# Patient Record
Sex: Female | Born: 1995
Health system: Southern US, Community
[De-identification: ages and names within clinical notes are randomized; demographics above are authoritative.]

## PROBLEM LIST (undated history)

## (undated) DIAGNOSIS — J45909 Unspecified asthma, uncomplicated: Secondary | ICD-10-CM

## (undated) DIAGNOSIS — O021 Missed abortion: Secondary | ICD-10-CM

## (undated) HISTORY — DX: Missed abortion: O02.1

---
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
## 2015-05-09 ENCOUNTER — Emergency Department (HOSPITAL_COMMUNITY)
Admission: EM | Admit: 2015-05-09 | Discharge: 2015-05-09 | Disposition: A | Discharge status: Home / Self Care | Payer: Medicaid Other | Attending: Emergency Medicine | Admitting: Emergency Medicine

## 2015-05-09 ENCOUNTER — Encounter (HOSPITAL_COMMUNITY): Payer: Self-pay | Admitting: Emergency Medicine

## 2015-05-09 DIAGNOSIS — Z3202 Encounter for pregnancy test, result negative: Secondary | ICD-10-CM | POA: Insufficient documentation

## 2015-05-09 DIAGNOSIS — Z79899 Other long term (current) drug therapy: Secondary | ICD-10-CM | POA: Diagnosis not present

## 2015-05-09 DIAGNOSIS — R51 Headache: Secondary | ICD-10-CM | POA: Insufficient documentation

## 2015-05-09 DIAGNOSIS — R55 Syncope and collapse: Secondary | ICD-10-CM | POA: Diagnosis present

## 2015-05-09 DIAGNOSIS — J45909 Unspecified asthma, uncomplicated: Secondary | ICD-10-CM | POA: Insufficient documentation

## 2015-05-09 DIAGNOSIS — Z91040 Latex allergy status: Secondary | ICD-10-CM | POA: Diagnosis not present

## 2015-05-09 DIAGNOSIS — D509 Iron deficiency anemia, unspecified: Secondary | ICD-10-CM

## 2015-05-09 LAB — POC URINE PREG, ED: Preg Test, Ur: NEGATIVE

## 2015-05-09 LAB — URINALYSIS, ROUTINE W REFLEX MICROSCOPIC
Bilirubin Urine: NEGATIVE
Glucose, UA: NEGATIVE mg/dL
Hgb urine dipstick: NEGATIVE
Ketones, ur: 15 mg/dL — AB
NITRITE: NEGATIVE
PH: 6.5 (ref 5.0–8.0)
Protein, ur: NEGATIVE mg/dL
SPECIFIC GRAVITY, URINE: 1.021 (ref 1.005–1.030)
UROBILINOGEN UA: 0.2 mg/dL (ref 0.0–1.0)

## 2015-05-09 LAB — CBC
HEMATOCRIT: 34.8 % — AB (ref 36.0–46.0)
HEMOGLOBIN: 10.8 g/dL — AB (ref 12.0–15.0)
MCH: 23.6 pg — ABNORMAL LOW (ref 26.0–34.0)
MCHC: 31 g/dL (ref 30.0–36.0)
MCV: 76 fL — ABNORMAL LOW (ref 78.0–100.0)
Platelets: 254 10*3/uL (ref 150–400)
RBC: 4.58 MIL/uL (ref 3.87–5.11)
RDW: 18.4 % — AB (ref 11.5–15.5)
WBC: 7 10*3/uL (ref 4.0–10.5)

## 2015-05-09 LAB — BASIC METABOLIC PANEL
ANION GAP: 7 (ref 5–15)
BUN: 11 mg/dL (ref 6–20)
CALCIUM: 8.9 mg/dL (ref 8.9–10.3)
CO2: 22 mmol/L (ref 22–32)
Chloride: 108 mmol/L (ref 101–111)
Creatinine, Ser: 0.8 mg/dL (ref 0.44–1.00)
Glucose, Bld: 96 mg/dL (ref 65–99)
POTASSIUM: 4.4 mmol/L (ref 3.5–5.1)
SODIUM: 137 mmol/L (ref 135–145)

## 2015-05-09 LAB — URINE MICROSCOPIC-ADD ON

## 2015-05-09 MED ORDER — DOCUSATE SODIUM 100 MG PO CAPS: 100.0000 mg | ORAL_CAPSULE | Freq: Two times a day (BID) | ORAL | Status: DC

## 2015-05-09 MED ORDER — FERROUS SULFATE 325 (65 FE) MG PO TABS: 325.0000 mg | ORAL_TABLET | Freq: Every day | ORAL | Status: DC

## 2015-05-09 MED ORDER — SODIUM CHLORIDE 0.9 % IV BOLUS (SEPSIS)
1000.0000 mL | Freq: Once | INTRAVENOUS | Status: AC
  Administered 2015-05-09: 1000 mL via INTRAVENOUS

## 2015-05-09 NOTE — ED Notes (Signed)
Per EMS: pt had near syncopal episode while [place] around [place]. Pt felt hot, sat down on floor and then laid down never lost consciousness. Has hx of near syncope.

## 2015-05-09 NOTE — ED Notes (Signed)
PA-C at bedside 

## 2015-05-09 NOTE — ED Notes (Signed)
Patient aware that a urine sample is needed, patient will notify staff when able.

## 2015-05-09 NOTE — ED Provider Notes (Signed)
CSN: [id number]     Arrival date & time 05/09/15  1632 History   First MD Initiated Contact with Patient 05/09/15 1701     Chief Complaint  Patient presents with  . Near Syncope   Allison Simmons is a 19 y.o. female with a history of anemia who presents to the ED with her mother after a syncopal episode while [place] [employer]. The patient reports she was shopping this afternoon and while [place] in [place] [employer] she felt lightheaded and like she might pass out. She reports sitting on the ground where her family reports she passed out for a split second. They deny any seizure-like activity. The patient denies any prodromal symptoms of chest pain, shortness of breath or palpitations. Patient complains of some positional lightheadedness. She also complains of a 3 out of 10 headache currently. Family denies any head injury or falls. Patient reports history of anemia. Patient does not take any iron supplementation. Her last cycle was 04/22/2015. Her primary care doctor is Dr. Sheliah Hatch. The patient denies fevers, chills, recent illness, chest pain, shortness of breath, palpitations, leg pain, leg swelling, abdominal pain, nausea, vomiting, urinary symptoms, numbness, tingling, weakness, double vision, cough or rashes.   (Consider location/radiation/quality/duration/timing/severity/associated sxs/prior Treatment) HPI  Past Medical History  Diagnosis Date  . Asthma    History reviewed. No pertinent past surgical history. History reviewed. No pertinent family history. History  Substance Use Topics  . Smoking status: Never Smoker   . Smokeless tobacco: Not on file  . Alcohol Use: Not on file   OB History    No data available     Review of Systems  Constitutional: Negative for fever and chills.  HENT: Negative for congestion and sore throat.   Eyes: Negative for pain and visual disturbance.  Respiratory: Negative for cough, shortness of breath and wheezing.   Cardiovascular: Negative for  chest pain, palpitations and leg swelling.  Gastrointestinal: Negative for nausea, vomiting, abdominal pain and diarrhea.  Genitourinary: Negative for dysuria, urgency, frequency, hematuria, difficulty urinating and menstrual problem.  Musculoskeletal: Negative for back pain and neck pain.  Skin: Negative for rash.  Neurological: Positive for syncope, light-headedness and headaches. Negative for dizziness, weakness and numbness.      Allergies  Latex  Home Medications   Prior to Admission medications   Medication Sig Start Date End Date Taking? Authorizing Provider  acetaminophen (TYLENOL) 500 MG tablet Take 500 mg by mouth once.   Yes Historical Provider, MD  levonorgestrel-ethinyl estradiol (AVIANE,ALESSE,LESSINA) 0.1-20 MG-MCG tablet Take 1 tablet by mouth daily.   Yes Historical Provider, MD  naproxen (NAPROSYN) 500 MG tablet Take 1 tablet (500 mg total) by mouth 2 (two) times daily. Patient taking differently: Take 500 mg by mouth 2 (two) times daily as needed for mild pain or moderate pain.  02/11/14  Yes Tatyana Kirichenko, PA-C  ranitidine (ZANTAC) 150 MG tablet Take 150 mg by mouth 2 (two) times daily as needed for heartburn.   Yes Historical Provider, MD  docusate sodium (COLACE) 100 MG capsule Take 1 capsule (100 mg total) by mouth every 12 (twelve) hours. 05/09/15   Everlene Farrier, PA-C  famotidine (PEPCID) 20 MG tablet Take 1 tablet (20 mg total) by mouth 2 (two) times daily. Patient not taking: Reported on 05/09/2015 02/11/14   Jaynie Crumble, PA-C  ferrous sulfate 325 (65 FE) MG tablet Take 1 tablet (325 mg total) by mouth daily. 05/09/15   Everlene Farrier, PA-C   BP 111/68 mmHg  Pulse 93  Temp(Src) 97.7 F (36.5 C) (Oral)  Resp 18  SpO2 100%  LMP 04/13/2015 (Approximate) Physical Exam  Constitutional: She is oriented to person, place, and time. She appears well-developed and well-nourished. No distress.  Nontoxic appearing.  HENT:  Head: Normocephalic and atraumatic.   Right Ear: External ear normal.  Left Ear: External ear normal.  Mouth/Throat: Oropharynx is clear and moist. No oropharyngeal exudate.  Bilateral tympanic membranes are pearly-gray without erythema or loss of landmarks. Head is atraumatic.   Eyes: Conjunctivae and EOM are normal. Pupils are equal, round, and reactive to light. Right eye exhibits no discharge. Left eye exhibits no discharge.  Neck: Normal range of motion. Neck supple. No JVD present. No tracheal deviation present.  Cardiovascular: Normal rate, regular rhythm, normal heart sounds and intact distal pulses.  Exam reveals no gallop and no friction rub.   No murmur heard. Bilateral radial and posterior tibialis pulses are intact.  Pulmonary/Chest: Effort normal and breath sounds normal. No respiratory distress. She has no wheezes. She has no rales. She exhibits no tenderness.  Lungs are CTA bilaterally. No chest wall tenderness.   Abdominal: Soft. Bowel sounds are normal. She exhibits no distension. There is no tenderness. There is no guarding.  Abdomen is soft and nontender to palpation.  Musculoskeletal: Normal range of motion. She exhibits no edema or tenderness.  No lower extremity edema or tenderness. Patient has 5 out of 5 strength in her bilateral upper and lower extremities. Patient is able to ambulate without difficulty or assistance and with a normal gait.  Lymphadenopathy:    She has no cervical adenopathy.  Neurological: She is alert and oriented to person, place, and time. No cranial nerve deficit. Coordination normal.  The patient is alert and oriented 3. Cranial nerves are intact. Finger to nose intact bilaterally. Sensation intact to bilateral upper extremities. Vision is grossly intact. EOMs intact bilaterally. Normal gait.  Skin: Skin is warm and dry. No rash noted. She is not diaphoretic. No erythema. No pallor.  Psychiatric: She has a normal mood and affect. Her behavior is normal.  Nursing note and vitals  reviewed.   ED Course  Procedures (including critical care time) Labs Review Labs Reviewed  CBC - Abnormal; Notable for the following:    Hemoglobin 10.8 (*)    HCT 34.8 (*)    MCV 76.0 (*)    MCH 23.6 (*)    RDW 18.4 (*)    All other components within normal limits  URINALYSIS, ROUTINE W REFLEX MICROSCOPIC (NOT AT [facility]) - Abnormal; Notable for the following:    APPearance CLOUDY (*)    Ketones, ur 15 (*)    Leukocytes, UA MODERATE (*)    All other components within normal limits  URINE MICROSCOPIC-ADD ON - Abnormal; Notable for the following:    Squamous Epithelial / LPF FEW (*)    Bacteria, UA FEW (*)    All other components within normal limits  URINE CULTURE  BASIC METABOLIC PANEL  POC URINE PREG, ED    Imaging Review No results found.   EKG Interpretation   Date/Time:  Saturday May 09 2015 16:44:37 EDT Ventricular Rate:  76 PR Interval:  128 QRS Duration: 71 QT Interval:  401 QTC Calculation: 451 R Axis:   50 Text Interpretation:  Sinus rhythm Benign early repolarization Confirmed  by LITTLE MD, RACHEL ([id number]) on 05/09/2015 8:01:02 PM      Filed Vitals:   05/09/15 1639 05/09/15 1847 05/09/15 2001  BP:  103/60 126/85 111/68  Pulse: 77 79 93  Temp: 97.7 F (36.5 C) 97.7 F (36.5 C)   TempSrc: Oral Oral   Resp: SpO2: 100% 100% 100%     MDM   Meds given in ED:  Medications  sodium chloride 0.9 % bolus 1,000 mL (0 mLs Intravenous Stopped 05/09/15 2002)    Discharge Medication List as of 05/09/2015  7:58 PM    START taking these medications   Details  docusate sodium (COLACE) 100 MG capsule Take 1 capsule (100 mg total) by mouth every 12 (twelve) hours., Starting 05/09/2015, Until Discontinued, Print    ferrous sulfate 325 (65 FE) MG tablet Take 1 tablet (325 mg total) by mouth daily., Starting 05/09/2015, Until Discontinued, Print        Final diagnoses:  Syncope and collapse  IDA (iron deficiency anemia)   This  is a 19 y.o. female  with a history of anemia who presents to the ED with her mother after a syncopal episode while [place] [employer]. The patient reports she was shopping this afternoon and while [place] [employer] she felt lightheaded and like she might pass out. She reports sitting on the ground where her family reports she passed out for a split second. The deny any seizure-like activity or falls. The patient denies any prodromal symptoms of chest pain, shortness of breath or palpitations. Patient complains of some positional lightheadedness.  On exam patient is afebrile nontoxic appearing. The patient has no focal neurological deficits. Her abdomen is soft and nontender to palpation. EKG is unremarkable. She is a negative urine pregnancy test. Urinalysis shows moderate leukocytes with few bacteria and is nitrite negative. Patient denies any urinary symptoms. Will send urine for culture. BMP is within normal limits. CBC indicates a hemoglobin of 10.8. CBC consistent with iron deficiency anemia. The patient has a history of IDA. On reevaluation patient reports she is feeling much better. She denies feeling lightheaded. She is able to ambulate with a normal gait. She denies feeling lightheaded or dizzy with ambulation. She reports feeling ready for discharge. We'll discharge with prescriptions for iron and Colace. I encouraged her to follow-up with her primary care provider. Strict return precautions provided. I advised the patient to follow-up with their primary care provider this week. I advised the patient to return to the emergency department with new or worsening symptoms or new concerns. The patient verbalized understanding and agreement with plan.      Everlene Farrier, PA-C 05/09/15 2015  Laurence Spates, MD 05/09/15 2229

## 2015-05-09 NOTE — Discharge Instructions (Signed)
Syncope °Syncope is a medical term for fainting or passing out. This means you lose consciousness and drop to the ground. People are generally unconscious for less than 5 minutes. You may have some muscle twitches for up to 15 seconds before waking up and returning to normal. Syncope occurs more often in older adults, but it can happen to anyone. While most causes of syncope are not dangerous, syncope can be a sign of a serious medical problem. It is important to seek medical care.  °CAUSES  °Syncope is caused by a sudden drop in blood flow to the brain. The specific cause is often not determined. Factors that can bring on syncope include: °· Taking medicines that lower blood pressure. °· Sudden changes in posture, such as standing up quickly. °· Taking more medicine than prescribed. °· Standing in one place for too long. °· Seizure disorders. °· Dehydration and excessive exposure to heat. °· Low blood sugar (hypoglycemia). °· Straining to have a bowel movement. °· Heart disease, irregular heartbeat, or other circulatory problems. °· Fear, emotional distress, seeing blood, or severe pain. °SYMPTOMS  °Right before fainting, you may: °· Feel dizzy or light-headed. °· Feel nauseous. °· See all white or all black in your field of vision. °· Have cold, clammy skin. °DIAGNOSIS  °Your health care provider will ask about your symptoms, perform a physical exam, and perform an electrocardiogram (ECG) to record the electrical activity of your heart. Your health care provider may also perform other heart or blood tests to determine the cause of your syncope which may include: °· Transthoracic echocardiogram (TTE). During echocardiography, sound waves are used to evaluate how blood flows through your heart. °· Transesophageal echocardiogram (TEE). °· Cardiac monitoring. This allows your health care provider to monitor your heart rate and rhythm in real time. °· Holter monitor. This is a portable device that records your  heartbeat and can help diagnose heart arrhythmias. It allows your health care provider to track your heart activity for several days, if needed. °· Stress tests by exercise or by giving medicine that makes the heart beat faster. °TREATMENT  °In most cases, no treatment is needed. Depending on the cause of your syncope, your health care provider may recommend changing or stopping some of your medicines. °HOME CARE INSTRUCTIONS °· Have someone stay with you until you feel stable. °· Do not drive, use machinery, or play sports until your health care provider says it is okay. °· Keep all follow-up appointments as directed by your health care provider. °· Lie down right away if you start feeling like you might faint. Breathe deeply and steadily. Wait until all the symptoms have passed. °· Drink enough fluids to keep your urine clear or pale yellow. °· If you are taking blood pressure or heart medicine, get up slowly and take several minutes to sit and then stand. This can reduce dizziness. °SEEK IMMEDIATE MEDICAL CARE IF:  °· You have a severe headache. °· You have unusual pain in the chest, abdomen, or back. °· You are bleeding from your mouth or rectum, or you have black or tarry stool. °· You have an irregular or very fast heartbeat. °· You have pain with breathing. °· You have repeated fainting or seizure-like jerking during an episode. °· You faint when sitting or lying down. °· You have confusion. °· You have trouble walking. °· You have severe weakness. °· You have vision problems. °If you fainted, call your local emergency services (911 in [place] Do not drive   yourself to the hospital.  MAKE SURE YOU:  Understand these instructions.  Will watch your condition.  Will get help right away if you are not doing well or get worse. Document Released: 09/26/2005 Document Revised: 10/01/2013 Document Reviewed: 11/25/2011 [facility] Patient Information 2015 [place], [place]. This information is not intended to replace  advice given to you by your health care provider. Make sure you discuss any questions you have with your health care provider. Iron Deficiency Anemia Anemia is a condition in which there are less red blood cells or hemoglobin in the blood than normal. Hemoglobin is the part of red blood cells that carries oxygen. Iron deficiency anemia is anemia caused by too little iron. It is the most common type of anemia. It may leave you tired and short of breath. CAUSES   Lack of iron in the diet.  Poor absorption of iron, as seen with intestinal disorders.  Intestinal bleeding.  Heavy periods. SIGNS AND SYMPTOMS  Mild anemia may not be noticeable. Symptoms may include:  Fatigue.  Headache.  Pale skin.  Weakness.  Tiredness.  Shortness of breath.  Dizziness.  Cold hands and feet.  Fast or irregular heartbeat. DIAGNOSIS  Diagnosis requires a thorough evaluation and physical exam by your health care provider. Blood tests are generally used to confirm iron deficiency anemia. Additional tests may be done to find the underlying cause of your anemia. These may include:  Testing for blood in the stool (fecal occult blood test).  A procedure to see inside the colon and rectum (colonoscopy).  A procedure to see inside the esophagus and stomach (endoscopy). TREATMENT  Iron deficiency anemia is treated by correcting the cause of the deficiency. Treatment may involve:  Adding iron-rich foods to your diet.  Taking iron supplements. Pregnant or breastfeeding women need to take extra iron because their normal diet usually does not provide the required amount.  Taking vitamins. Vitamin C improves the absorption of iron. Your health care provider may recommend that you take your iron tablets with a glass of orange juice or vitamin C supplement.  Medicines to make heavy menstrual flow lighter.  Surgery. HOME CARE INSTRUCTIONS   Take iron as directed by your health care provider.  If you  cannot tolerate taking iron supplements by mouth, talk to your health care provider about taking them through a vein (intravenously) or an injection into a muscle.  For the best iron absorption, iron supplements should be taken on an empty stomach. If you cannot tolerate them on an empty stomach, you may need to take them with food.  Do not drink milk or take antacids at the same time as your iron supplements. Milk and antacids may interfere with the absorption of iron.  Iron supplements can cause constipation. Make sure to include fiber in your diet to prevent constipation. A stool softener may also be recommended.  Take vitamins as directed by your health care provider.  Eat a diet rich in iron. Foods high in iron include liver, lean beef, whole-grain bread, eggs, dried fruit, and dark green leafy vegetables. SEEK IMMEDIATE MEDICAL CARE IF:   You faint. If this happens, do not drive. Call your local emergency services (911 in [place] if no other help is available.  You have chest pain.  You feel nauseous or vomit.  You have severe or increased shortness of breath with activity.  You feel weak.  You have a rapid heartbeat.  You have unexplained sweating.  You become light-headed when getting up  from a chair or bed. MAKE SURE YOU:   Understand these instructions.  Will watch your condition.  Will get help right away if you are not doing well or get worse. Document Released: 09/23/2000 Document Revised: 10/01/2013 Document Reviewed: 06/03/2013 [facility] Patient Information 2015 [place], [place]. This information is not intended to replace advice given to you by your health care provider. Make sure you discuss any questions you have with your health care provider.

## 2015-05-09 NOTE — ED Notes (Signed)
Patient is unable to use restroom at this time. Will let us know when she is able to go.

## 2015-05-09 NOTE — ED Notes (Signed)
AVS explained in detail. Knows to take antacids separate from iron supplements. Encouraged to eat a diet high in fiber and to follow up with pediatrics. Knows stool may change colors while taking iron supplements. No other c/c. Ambulatory with steady gait.

## 2015-05-12 LAB — URINE CULTURE

## 2015-12-17 ENCOUNTER — Ambulatory Visit: Payer: Self-pay | Admitting: Obstetrics

## 2016-04-14 ENCOUNTER — Inpatient Hospital Stay (HOSPITAL_COMMUNITY)
Admission: AD | Admit: 2016-04-14 | Discharge: 2016-04-14 | Disposition: A | Discharge status: Home / Self Care | Payer: 59 | Source: Ambulatory Visit | Attending: Obstetrics and Gynecology | Admitting: Obstetrics and Gynecology

## 2016-04-14 ENCOUNTER — Encounter (HOSPITAL_COMMUNITY): Payer: Self-pay | Admitting: *Deleted

## 2016-04-14 ENCOUNTER — Inpatient Hospital Stay (HOSPITAL_COMMUNITY): Payer: 59

## 2016-04-14 DIAGNOSIS — O3680X0 Pregnancy with inconclusive fetal viability, not applicable or unspecified: Secondary | ICD-10-CM

## 2016-04-14 DIAGNOSIS — IMO0002 Reserved for concepts with insufficient information to code with codable children: Secondary | ICD-10-CM

## 2016-04-14 DIAGNOSIS — Z91040 Latex allergy status: Secondary | ICD-10-CM | POA: Insufficient documentation

## 2016-04-14 DIAGNOSIS — O0289 Other abnormal products of conception: Secondary | ICD-10-CM | POA: Diagnosis not present

## 2016-04-14 DIAGNOSIS — O021 Missed abortion: Secondary | ICD-10-CM | POA: Insufficient documentation

## 2016-04-14 LAB — POCT PREGNANCY, URINE: PREG TEST UR: POSITIVE — AB

## 2016-04-14 NOTE — Discharge Instructions (Signed)
Incomplete Miscarriage A miscarriage is the sudden loss of an unborn baby (fetus) before the 20th week of pregnancy. In an incomplete miscarriage, parts of the fetus or placenta (afterbirth) remain in the body.  Having a miscarriage can be an emotional experience. Talk with your health care provider about any questions you may have about miscarrying, the grieving process, and your future pregnancy plans. CAUSES   Problems with the fetal chromosomes that make it impossible for the baby to develop normally. Problems with the baby's genes or chromosomes are most often the result of errors that occur by chance as the embryo divides and grows. The problems are not inherited from the parents.  Infection of the cervix or uterus.  Hormone problems.  Problems with the cervix, such as having an incompetent cervix. This is when the tissue in the cervix is not strong enough to hold the pregnancy.  Problems with the uterus, such as an abnormally shaped uterus, uterine fibroids, or congenital abnormalities.  Certain medical conditions.  Smoking, drinking alcohol, or taking illegal drugs.  Trauma. SYMPTOMS   Vaginal bleeding or spotting, with or without cramps or pain.  Pain or cramping in the abdomen or lower back.  Passing fluid, tissue, or blood clots from the vagina. DIAGNOSIS  Your health care provider will perform a physical exam. You may also have an ultrasound to confirm the miscarriage. Blood or urine tests may also be ordered. TREATMENT   Usually, a dilation and curettage (D&C) procedure is performed. During a D&C procedure, the cervix is widened (dilated) and any remaining fetal or placental tissue is gently removed from the uterus.  Antibiotic medicines are prescribed if there is an infection. Other medicines may be given to reduce the size of the uterus (contract) if there is a lot of bleeding.  If you have Rh negative blood and your baby was Rh positive, you will need a Rho (D)  immune globulin shot. This shot will protect any future baby from having Rh blood problems in future pregnancies.  You may be confined to bed rest. This means you should stay in bed and only get up to use the bathroom. HOME CARE INSTRUCTIONS   Rest as directed by your health care provider.  Restrict activity as directed by your health care provider. You may be allowed to continue light activity if curettage was not done but you require further treatment.  Keep track of the number of pads you use each day. Keep track of how soaked (saturated) they are. Record this information.  Do not  use tampons.  Do not douche or have sexual intercourse until approved by your health care provider.  Keep all follow-up appointments for reevaluation and continuing management.  Only take over-the-counter or prescription medicines for pain, fever, or discomfort as directed by your health care provider.  Take antibiotic medicine as directed by your health care provider. Make sure you finish it even if you start to feel better. SEEK IMMEDIATE MEDICAL CARE IF:   You experience severe cramps in your stomach, back, or abdomen.  You have an unexplained temperature (make sure to record these temperatures).  You pass large clots or tissue (save these for your health care provider to inspect).  Your bleeding increases.  You become light-headed, weak, or have fainting episodes. MAKE SURE YOU:   Understand these instructions.  Will watch your condition.  Will get help right away if you are not doing well or get worse.   This information is not intended to   replace advice given to you by your health care provider. Make sure you discuss any questions you have with your health care provider.   Document Released: 09/26/2005 Document Revised: 10/17/2014 Document Reviewed: 04/25/2013 Elsevier Interactive Patient Education 2016 Elsevier Inc.  

## 2016-04-14 NOTE — MAU Note (Signed)
Urine in lab 

## 2016-04-14 NOTE — MAU Note (Signed)
Pt just left the pregnancy care center and u/s there did not detect a heartbeat and measured her at 8 weeks when she should be 10 weeks.

## 2016-04-14 NOTE — MAU Provider Note (Signed)
History     CSN: [id number]  Arrival date and time: 04/14/16 1842   None     No chief complaint on file.  HPI Comments: G1 @[redacted]w[redacted]d  by sure LMP here for confirmation of pregnancy viability. Pt seen at the pregnancy care center earlier today and was told her pregnancy measured 8 wks with absent cardiac activity. She denies VB, cramping, or pain. She reports some nausea but tolerating po w/o problem. [place]    [place]    Gravida Para Term Preterm AB TAB SAB Ectopic Multiple Living   1               Past Medical History  Diagnosis Date  . Asthma     No past surgical history on file.  No family history on file.  Social History  Substance Use Topics  . Smoking status: Never Smoker   . Smokeless tobacco: Not on file  . Alcohol Use: Not on file    Allergies:  Allergies  Allergen Reactions  . Latex Rash    Prescriptions prior to admission  Medication Sig Dispense Refill Last Dose  . acetaminophen (TYLENOL) 500 MG tablet Take 500 mg by mouth once.   05/04/2015  . docusate sodium (COLACE) 100 MG capsule Take 1 capsule (100 mg total) by mouth every 12 (twelve) hours. 60 capsule 0   . famotidine (PEPCID) 20 MG tablet Take 1 tablet (20 mg total) by mouth 2 (two) times daily. (Patient not taking: Reported on 05/09/2015) 30 tablet 0 Not Taking at Unknown time  . ferrous sulfate 325 (65 FE) MG tablet Take 1 tablet (325 mg total) by mouth daily. 30 tablet 0   . levonorgestrel-ethinyl estradiol (AVIANE,ALESSE,LESSINA) 0.1-20 MG-MCG tablet Take 1 tablet by mouth daily.   05/09/2015 at Unknown time  . naproxen (NAPROSYN) 500 MG tablet Take 1 tablet (500 mg total) by mouth 2 (two) times daily. (Patient taking differently: Take 500 mg by mouth 2 (two) times daily as needed for mild pain or moderate pain. ) 30 tablet 0 Past Week at Unknown time  . ranitidine (ZANTAC) 150 MG tablet Take 150 mg by mouth 2 (two) times daily as needed  for heartburn.   2 weeks    Review of Systems  Constitutional: Negative.   HENT: Negative.   Eyes: Negative.   Respiratory: Negative.   Cardiovascular: Negative.   Gastrointestinal: Positive for nausea. Negative for vomiting.  Genitourinary:       +polyuria  Musculoskeletal: Negative.   Skin: Negative.   Neurological: Negative.   Endo/Heme/Allergies: Negative.   Psychiatric/Behavioral: Negative.    Physical Exam   Blood pressure 111/66, pulse 71, temperature 98.2 F (36.8 C), temperature source Oral, resp. rate 17, height 5' (1.524 m), weight 104 lb (47.174 kg), last menstrual period 02/02/2016, SpO2 100 %.  Physical Exam  Constitutional: She is oriented to person, place, and time. She appears well-developed and well-nourished.  HENT:  Head: Normocephalic and atraumatic.  Neck: Normal range of motion.  Cardiovascular: Normal rate.   Respiratory: Effort normal.  GI:  deferred  Genitourinary:  deferred  Musculoskeletal: Normal range of motion.  Neurological: She is alert and oriented to person, place, and time.  Psychiatric: She has a normal mood and affect.    MAU Course  Procedures Results for orders placed or performed during the hospital encounter of 04/14/16 (from the past 24 hour(s))  Pregnancy, urine POC  Status: Abnormal   Collection Time: 04/14/16  6:58 PM  Result Value Ref Range   Preg Test, Ur POSITIVE (A) NEGATIVE   [place] Ob Comp Less 14 Wks  04/14/2016  CLINICAL DATA:  Early pregnancy.  No fetal heart tones demonstrated. EXAM: OBSTETRIC <14 WK US AND TRANSVAGINAL OB US TECHNIQUE: Both transabdominal and transvaginal ultrasound examinations were performed for complete evaluation of the gestation as well as the maternal uterus, adnexal regions, and pelvic cul-de-sac. Transvaginal technique was performed to assess early pregnancy. COMPARISON:  None. FINDINGS: Intrauterine gestational sac: Single, grossly normal. Yolk sac:  Present Embryo:  Present Cardiac  Activity: Absent CRL:  25  mm   9 w   1 d Subchorionic hemorrhage:  None visualized. Maternal uterus/adnexae: Normal IMPRESSION: Fetal demise. No cardiac activity, with gestational age of [redacted] weeks 1 day by crown-rump length Electronically Signed   By: Paulina FusiMark  Shogry M.D.   On: 04/14/2016 21:07   [place] Ob Transvaginal  04/14/2016  CLINICAL DATA:  Early pregnancy.  No fetal heart tones demonstrated. EXAM: OBSTETRIC <14 WK US AND TRANSVAGINAL OB US TECHNIQUE: Both transabdominal and transvaginal ultrasound examinations were performed for complete evaluation of the gestation as well as the maternal uterus, adnexal regions, and pelvic cul-de-sac. Transvaginal technique was performed to assess early pregnancy. COMPARISON:  None. FINDINGS: Intrauterine gestational sac: Single, grossly normal. Yolk sac:  Present Embryo:  Present Cardiac Activity: Absent CRL:  25  mm   9 w   1 d Subchorionic hemorrhage:  None visualized. Maternal uterus/adnexae: Normal IMPRESSION: Fetal demise. No cardiac activity, with gestational age of [redacted] weeks 1 day by crown-rump length Electronically Signed   By: Paulina FusiMark  Shogry M.D.   On: 04/14/2016 21:07     MDM Labs and sono ordered and reviewed. 443w1d IUP w/o cardiac activity. Discussed options: Cytotec vs. expectant mngt, pt prefers expectant mngt for now. SAB precautions discussed. Stable for discharge home.  Assessment and Plan   1. Non-viable pregnancy   2. Pregnancy with inconclusive fetal viability   3. Fetal demise, less than 22 weeks     Discharge home Follow-up in [facility] in 1 wk or sooner if desires medical mngt-message routed to clinic SAB precautions   Donette LarryMelanie Raiyan D[place]andro, CNM 04/14/2016, 7:31 PM

## 2016-04-29 ENCOUNTER — Ambulatory Visit (INDEPENDENT_AMBULATORY_CARE_PROVIDER_SITE_OTHER): Payer: 59 | Admitting: Advanced Practice Midwife

## 2016-04-29 ENCOUNTER — Encounter: Payer: Self-pay | Admitting: Advanced Practice Midwife

## 2016-04-29 VITALS — BP 113/74 | HR 90 | Wt 108.3 lb

## 2016-04-29 DIAGNOSIS — O3680X0 Pregnancy with inconclusive fetal viability, not applicable or unspecified: Secondary | ICD-10-CM | POA: Diagnosis not present

## 2016-04-29 DIAGNOSIS — O034 Incomplete spontaneous abortion without complication: Secondary | ICD-10-CM

## 2016-04-29 DIAGNOSIS — O021 Missed abortion: Secondary | ICD-10-CM

## 2016-04-29 DIAGNOSIS — Z3490 Encounter for supervision of normal pregnancy, unspecified, unspecified trimester: Secondary | ICD-10-CM

## 2016-04-29 DIAGNOSIS — O3680X1 Pregnancy with inconclusive fetal viability, fetus 1: Secondary | ICD-10-CM

## 2016-04-29 DIAGNOSIS — Z36 Encounter for antenatal screening of mother: Secondary | ICD-10-CM | POA: Diagnosis not present

## 2016-04-29 HISTORY — DX: Missed abortion: O02.1

## 2016-04-29 LAB — CBC
HCT: 37.4 % (ref 35.0–45.0)
Hemoglobin: 12.2 g/dL (ref 11.7–15.5)
MCH: 26.8 pg — AB (ref 27.0–33.0)
MCHC: 32.6 g/dL (ref 32.0–36.0)
MCV: 82.2 fL (ref 80.0–100.0)
MPV: 10.4 fL (ref 7.5–12.5)
Platelets: 280 10*3/uL (ref 140–400)
RBC: 4.55 MIL/uL (ref 3.80–5.10)
RDW: 14.4 % (ref 11.0–15.0)
WBC: 5.7 10*3/uL (ref 3.8–10.8)

## 2016-04-29 MED ORDER — PROMETHAZINE HCL 25 MG PO TABS: 25.0000 mg | ORAL_TABLET | Freq: Four times a day (QID) | ORAL | Status: DC | PRN

## 2016-04-29 MED ORDER — IBUPROFEN 600 MG PO TABS: 600.0000 mg | ORAL_TABLET | Freq: Four times a day (QID) | ORAL | Status: DC | PRN

## 2016-04-29 MED ORDER — MISOPROSTOL 200 MCG PO TABS
ORAL_TABLET | ORAL | Status: DC
Start: 2016-04-29 — End: 2018-12-12

## 2016-04-29 NOTE — Patient Instructions (Addendum)
Incomplete Miscarriage A miscarriage is the sudden loss of an unborn baby (fetus) before the 20th week of pregnancy. In an incomplete miscarriage, parts of the fetus or placenta (afterbirth) remain in the body.  Having a miscarriage can be an emotional experience. Talk with your health care provider about any questions you may have about miscarrying, the grieving process, and your future pregnancy plans. CAUSES   Problems with the fetal chromosomes that make it impossible for the baby to develop normally. Problems with the baby's genes or chromosomes are most often the result of errors that occur by chance as the embryo divides and grows. The problems are not inherited from the parents.  Infection of the cervix or uterus.  Hormone problems.  Problems with the cervix, such as having an incompetent cervix. This is when the tissue in the cervix is not strong enough to hold the pregnancy.  Problems with the uterus, such as an abnormally shaped uterus, uterine fibroids, or congenital abnormalities.  Certain medical conditions.  Smoking, drinking alcohol, or taking illegal drugs.  Trauma. SYMPTOMS   Vaginal bleeding or spotting, with or without cramps or pain.  Pain or cramping in the abdomen or lower back.  Passing fluid, tissue, or blood clots from the vagina. DIAGNOSIS  Your health care provider will perform a physical exam. You may also have an ultrasound to confirm the miscarriage. Blood or urine tests may also be ordered. TREATMENT   You may be given medicine to make the uterus cramp and pass any remaining pregnancy tissue.   A dilation and curettage (D&C) procedure is performed. During a D&C procedure, the cervix is widened (dilated) and any remaining fetal or placental tissue is gently removed from the uterus.  Antibiotic medicines are prescribed if there is an infection. Other medicines may be given to reduce the size of the uterus (contract) if there is a lot of bleeding.  If  you have Rh negative blood and your baby was Rh positive, you will need a Rho (D) immune globulin shot. This shot will protect any future baby from having Rh blood problems in future pregnancies.  You may be confined to bed rest. This means you should stay in bed and only get up to use the bathroom. HOME CARE INSTRUCTIONS   Rest as directed by your health care provider.  Restrict activity as directed by your health care provider. You may be allowed to continue light activity if curettage was not done but you require further treatment.  Keep track of the number of pads you use each day. Keep track of how soaked (saturated) they are. Record this information.  Do not  use tampons.  Do not douche or have sexual intercourse until approved by your health care provider.  Keep all follow-up appointments for reevaluation and continuing management.  Only take over-the-counter or prescription medicines for pain, fever, or discomfort as directed by your health care provider.  Take antibiotic medicine as directed by your health care provider. Make sure you finish it even if you start to feel better. SEEK IMMEDIATE MEDICAL CARE IF:   You experience severe cramps in your stomach, back, or abdomen.  You have an unexplained temperature (make sure to record these temperatures).  You pass large clots or tissue (save these for your health care provider to inspect).  Your bleeding increases.  You become light-headed, weak, or have fainting episodes. MAKE SURE YOU:   Understand these instructions.  Will watch your condition.  Will get help right away  if you are not doing well or get worse.   This information is not intended to replace advice given to you by your health care provider. Make sure you discuss any questions you have with your health care provider.   Document Released: 09/26/2005 Document Revised: 10/17/2014 Document Reviewed: 04/25/2013 Elsevier Interactive Patient Education [id number]  Elsevier Inc.

## 2016-04-29 NOTE — Progress Notes (Signed)
Subjective:     Patient ID: Allison Simmons, female   DOB: May 18, 1996, 20 y.o.   MRN: [id number]  HPI F/U 9 week missed AB. Wants confirmation US. States her family often has ultrasound where you can't see that baby's heartbeat even when the baby is alive.   Thinks her blood type is O pos.   Review of Systems  No fever, chills, cramping or bleeding     Objective:   Physical Exam BP 113/74 mmHg  Pulse 90  Wt 108 lb 4.8 oz (49.125 kg)  LMP 02/02/2016 General: NAD Heart: Regular rate Lungs: Normal effort and rate Abd: Soft, NT Pelvic: Deferred  US verifies no cardiac activity per M-mode doppler. CRL 8.5 weeks. Pt and mother visualized US images and verbalize understanding that this is a fetal demise. Tearful, but appropriate.     Assessment:     1. Pregnancy  - ABO AND RH  - CBC  2. Incomplete miscarriage  - misoprostol (CYTOTEC) 200 MCG tablet; Place four tablets in between your gums and cheeks (two tablets on each side) as instructed OR insert four tablets vaginally. If you do not passage tissue after 24 hours take second dose. If you do not pass tissue 24 hours after second dose, call the office to discuss D&C.  Dispense: 4 tablet; Refill: 1 - ibuprofen (ADVIL,MOTRIN) 600 MG tablet; Take 1 tablet (600 mg total) by mouth every 6 (six) hours as needed for cramping.  Dispense: 30 tablet; Refill: 1 - promethazine (PHENERGAN) 25 MG tablet; Take 1 tablet (25 mg total) by mouth every 6 (six) hours as needed for nausea or vomiting.  Dispense: 30 tablet; Refill: 1  - Support given. Declines to talk to Counselor or Chaplain. Heartstring info given. Pt going back to college out of town and cannot go to F/U appt here. Strongly suggest that she find Gyn and EDs near college for F/U appt or emergencies.   [place] Elvis Laufer, CNM 04/29/2016 1:07 PM

## 2016-04-29 NOTE — Progress Notes (Signed)
Bedside US for viability = Single IUP; FHR - absent; CRL - 2.18cm (6952w5d)

## 2016-04-30 LAB — ABO AND RH: RH TYPE: POSITIVE

## 2017-02-17 ENCOUNTER — Encounter (HOSPITAL_COMMUNITY): Payer: Self-pay

## 2017-11-04 IMAGING — US US OB TRANSVAGINAL
1 series · 15 of 28 positions shown · non-contrast
Comparison: None.

CLINICAL DATA: Early pregnancy.  No fetal heart tones demonstrated.

EXAM:
OBSTETRIC <14 WK US AND TRANSVAGINAL OB US
TECHNIQUE: Both transabdominal and transvaginal ultrasound examinations were
performed for complete evaluation of the gestation as well as the
maternal uterus, adnexal regions, and pelvic cul-de-sac.
Transvaginal technique was performed to assess early pregnancy.

[Series 1: us ob transvaginal · 15 of 67 slices shown]
[im 1/67]
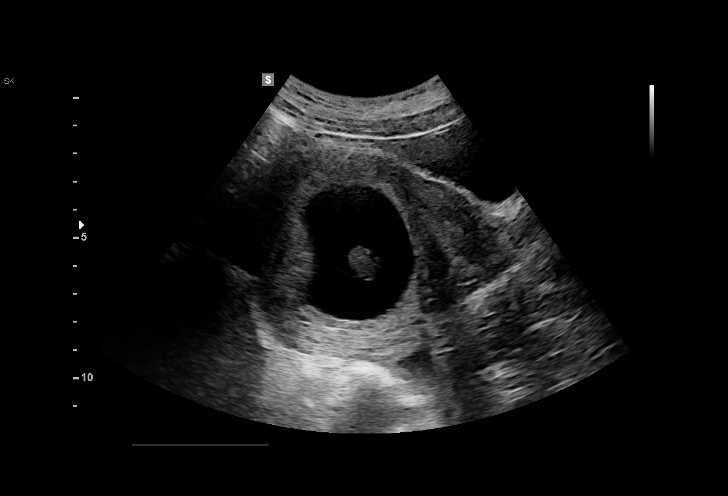
[im 5/67]
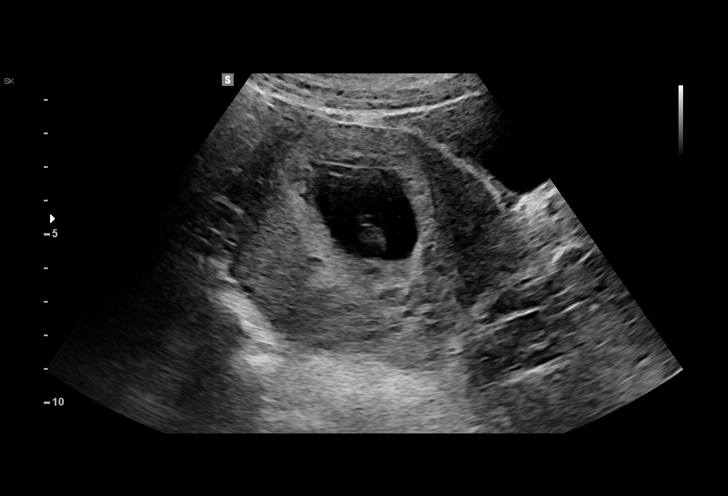
[im 10/67]
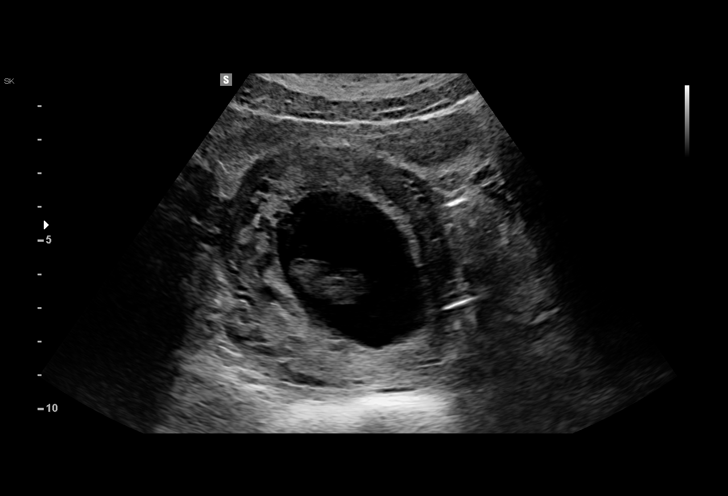
[im 15/67]
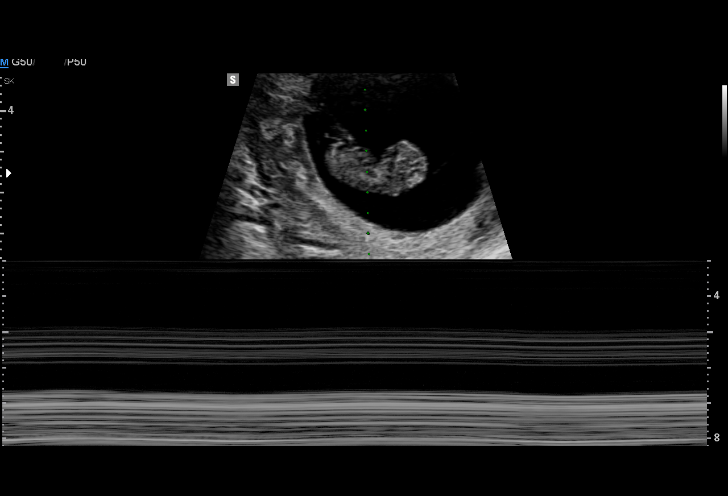
[im 20/67]
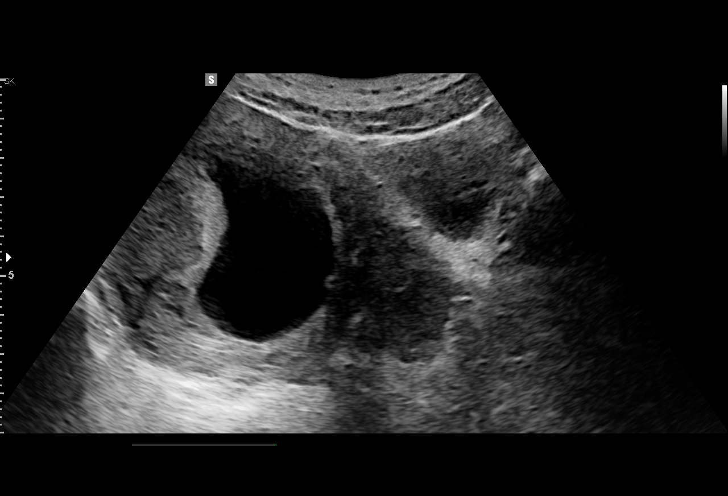
[im 25/67]
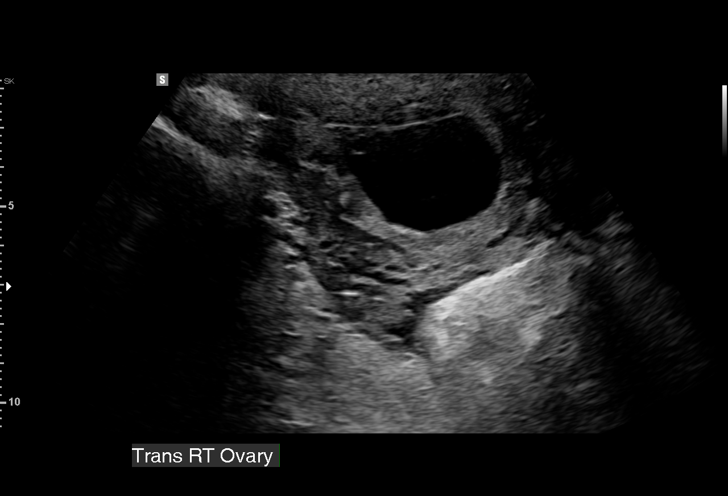
[im 30/67]
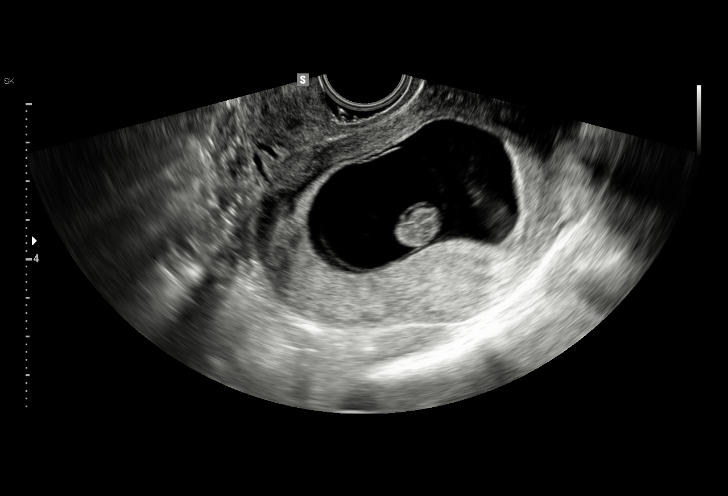
[im 35/67]
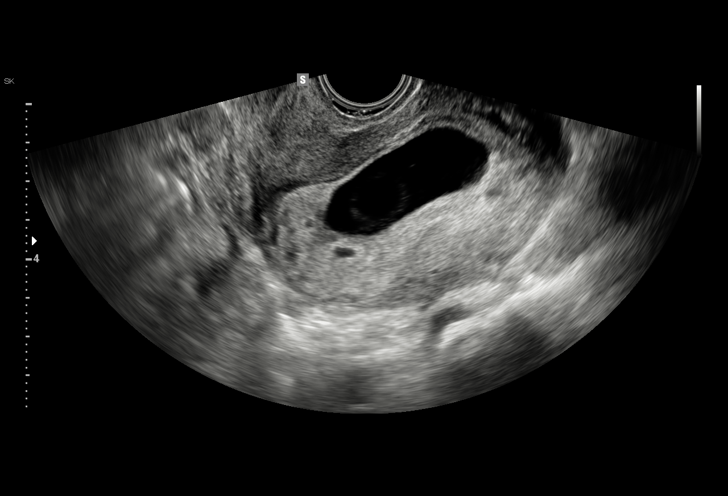
[im 37/67]
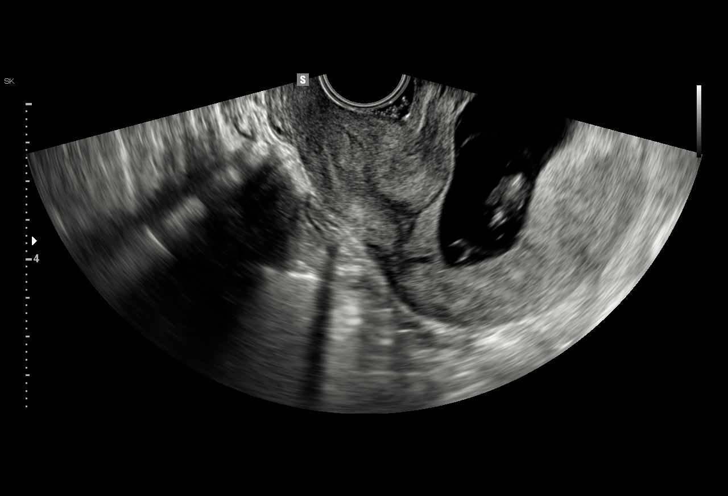
[im 42/67]
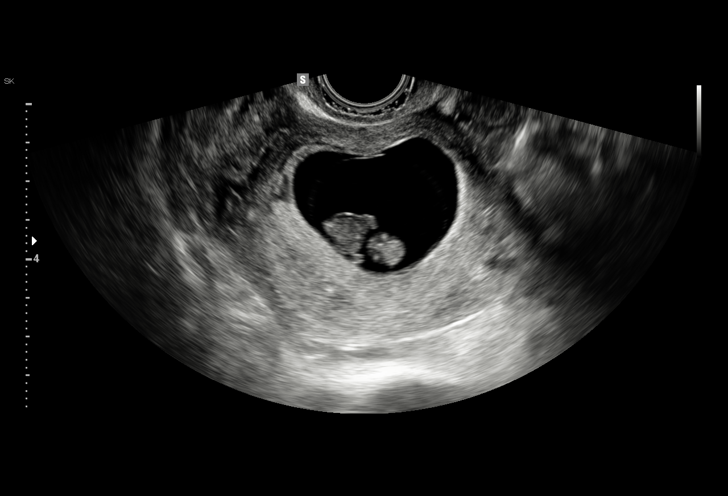
[im 47/67]
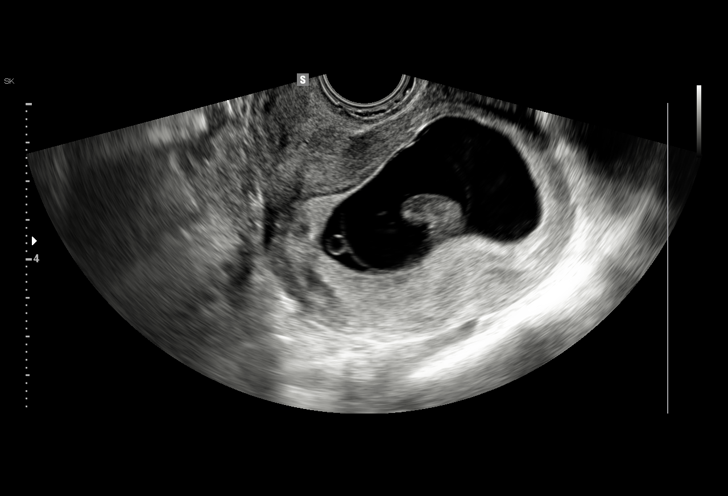
[im 52/67]
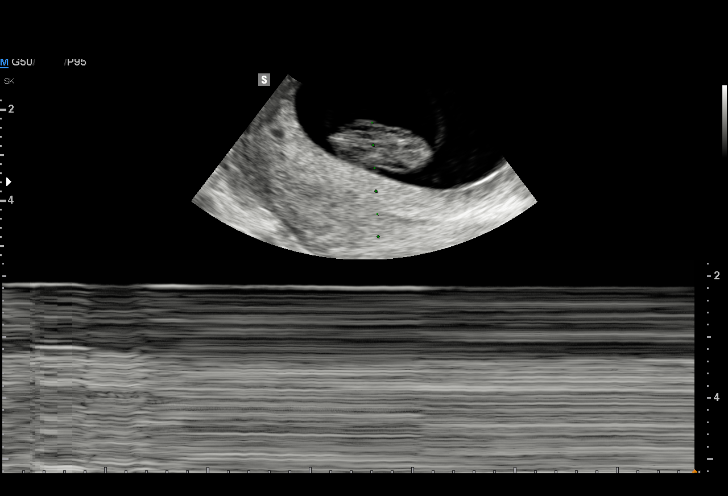
[im 57/67]
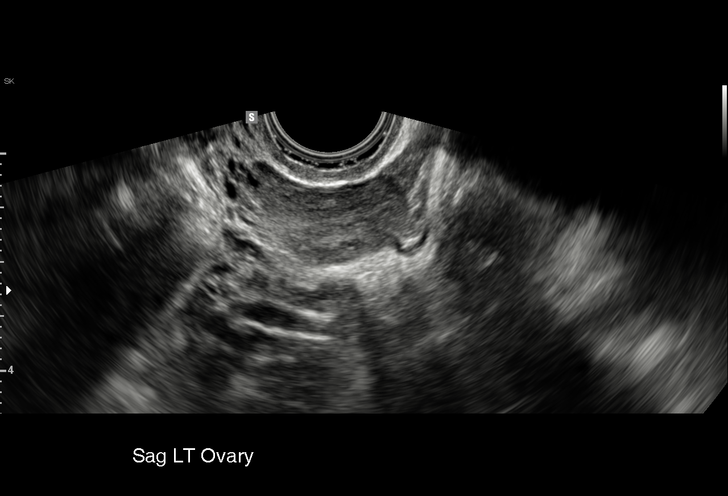
[im 62/67]
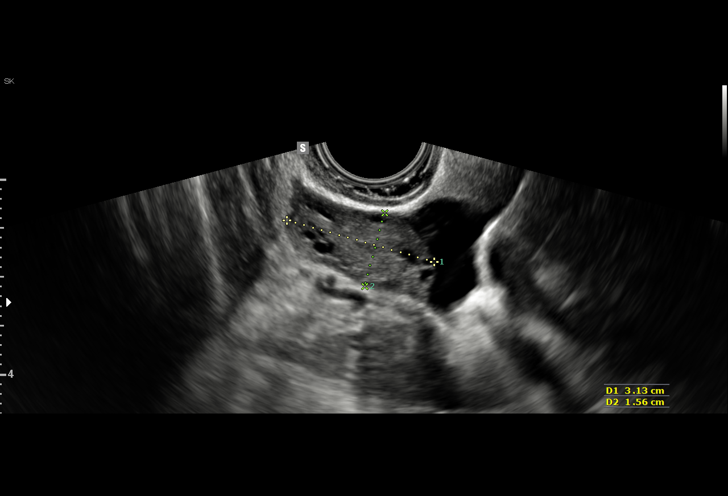
[im 67/67]
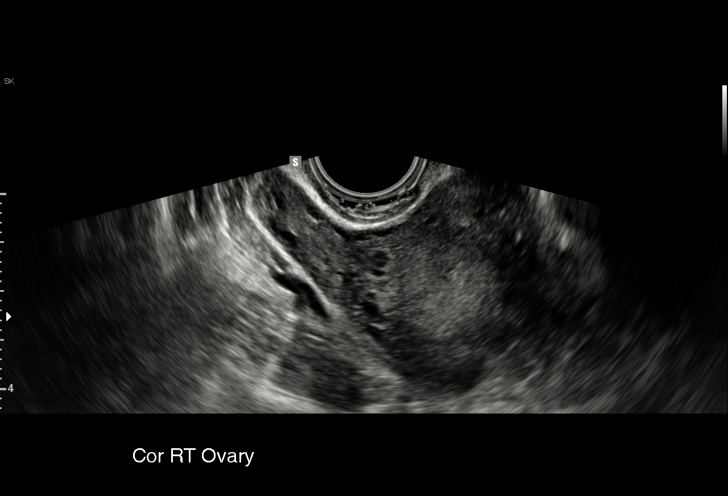

[15 of 28 positions shown; findings below may reference images not displayed]

FINDINGS: Intrauterine gestational sac: Single, grossly normal.

Yolk sac:  Present

Embryo:  Present

Cardiac Activity: Absent

CRL:  25  mm   9 w   1 d

Subchorionic hemorrhage:  None visualized.

Maternal uterus/adnexae: Normal
IMPRESSION: Fetal demise. No cardiac activity, with gestational age of 9 weeks 1
day by crown-rump length

## 2018-12-12 ENCOUNTER — Other Ambulatory Visit: Payer: Self-pay

## 2018-12-12 ENCOUNTER — Ambulatory Visit (INDEPENDENT_AMBULATORY_CARE_PROVIDER_SITE_OTHER): Payer: 59 | Admitting: Family Medicine

## 2018-12-12 ENCOUNTER — Encounter: Payer: Self-pay | Admitting: Family Medicine

## 2018-12-12 VITALS — BP 109/76 | HR 106 | Temp 98.0°F | Resp 16 | Ht 61.0 in | Wt 109.0 lb

## 2018-12-12 DIAGNOSIS — N63 Unspecified lump in unspecified breast: Secondary | ICD-10-CM

## 2018-12-12 NOTE — Progress Notes (Signed)
Established Patient Office Visit  Subjective:  Patient ID: Allison Simmons, female    DOB: 1996/06/05  Age: 23 y.o. MRN: [id number]  CC:  Chief Complaint  Patient presents with  . Breast Mass    left breast has a mass, pt states she noticed the lump about 2 weeks ago.     HPI Allison Simmons presents for  Patient reports that she started having a breast lump She states that her last period was February 13 and lasted 5 days.  She does not take hormonal contraceptive medications.  She states that as a teenager she had a right breast underneath her nipple that was evaluated with an ultrasound was found to be benign.  She denies a family history of any breast cancer.  Her left breast lump currently is close to her.  Also the sternum.  She denies any trauma such as an elbow to the chest or climbing or injury.  She has not been changing her diet and has not use caffeine.  Past Medical History:  Diagnosis Date  . Asthma   . Missed abortion 04/29/2016   Cytotec    History reviewed. No pertinent surgical history.  History reviewed. No pertinent family history.  Social History   Socioeconomic History  . Marital status: Married    Spouse name: Not on file  . Number of children: Not on file  . Years of education: Not on file  . Highest education level: Not on file  Occupational History  . Not on file  Social Needs  . Financial resource strain: Not on file  . Food insecurity:    Worry: Not on file    Inability: Not on file  . Transportation needs:    Medical: Not on file    Non-medical: Not on file  Tobacco Use  . Smoking status: Never Smoker  . Smokeless tobacco: Never Used  Substance and Sexual Activity  . Alcohol use: Not on file  . Drug use: Not on file  . Sexual activity: Not on file  Lifestyle  . Physical activity:    Days per week: Not on file    Minutes per session: Not on file  . Stress: Not on file  Relationships  . Social connections:    Talks on phone: Not  on file    Gets together: Not on file    Attends religious service: Not on file    Active member of club or organization: Not on file    Attends meetings of clubs or organizations: Not on file    Relationship status: Not on file  . Intimate partner violence:    Fear of current or ex partner: Not on file    Emotionally abused: Not on file    Physically abused: Not on file    Forced sexual activity: Not on file  Other Topics Concern  . Not on file  Social History Narrative  . Not on file    Outpatient Medications Prior to Visit  Medication Sig Dispense Refill  . acetaminophen (TYLENOL) 500 MG tablet Take 500 mg by mouth once. Reported on 04/29/2016    . docusate sodium (COLACE) 100 MG capsule Take 1 capsule (100 mg total) by mouth every 12 (twelve) hours. 60 capsule 0  . ferrous sulfate 325 (65 FE) MG tablet Take 1 tablet (325 mg total) by mouth daily. 30 tablet 0  . ibuprofen (ADVIL,MOTRIN) 600 MG tablet Take 1 tablet (600 mg total) by mouth every 6 (six) hours  as needed for cramping. 30 tablet 1  . misoprostol (CYTOTEC) 200 MCG tablet Place four tablets in between your gums and cheeks (two tablets on each side) as instructed OR insert four tablets vaginally. If you do not passage tissue after 24 hours take second dose. If you do not pass tissue 24 hours after second dose, call the office to discuss D&C. 4 tablet 1  . promethazine (PHENERGAN) 25 MG tablet Take 1 tablet (25 mg total) by mouth every 6 (six) hours as needed for nausea or vomiting. 30 tablet 1   No facility-administered medications prior to visit.     Allergies  Allergen Reactions  . Latex Rash    ROS Review of Systems    Objective:     BP 109/76   Pulse (!) 106   Temp 98 F (36.7 C) (Oral)   Resp 16   Ht 5\' 1"  (1.549 m)   Wt 109 lb (49.4 kg)   LMP 11/22/2018 (Approximate)   SpO2 100%   BMI 20.60 kg/m  Wt Readings from Last 3 Encounters:  12/12/18 109 lb (49.4 kg)  04/29/16 108 lb 4.8 oz (49.1 kg) (12  %, Z= -1.16)*  04/14/16 104 lb (47.2 kg) (7 %, Z= -1.48)*   * Growth percentiles are based on CDC (Girls, 2-20 Years) data.    Physical Exam  Constitutional: She appears well-developed and well-nourished.  HENT:  Head: Normocephalic and atraumatic.  Eyes: Conjunctivae and EOM are normal.  Pulmonary/Chest: Effort normal. Right breast exhibits no inverted nipple, no nipple discharge and no skin change. Left breast exhibits no inverted nipple, no nipple discharge and no skin change.    Lymphadenopathy:       Right axillary: No pectoral and no lateral adenopathy present.       Left axillary: No pectoral and no lateral adenopathy present. Psychiatric: She has a normal mood and affect. Her behavior is normal. Judgment and thought content normal.      Health Maintenance Due  Topic Date Due  . HIV Screening  06/21/2011  . TETANUS/TDAP  06/21/2015  . PAP-Cervical Cytology Screening  06/20/2017  . PAP SMEAR-Modifier  06/20/2017    There are no preventive care reminders to display for this patient.  No results found for: TSH Lab Results  Component Value Date   WBC 5.7 04/29/2016   HGB 12.2 04/29/2016   HCT 37.4 04/29/2016   MCV 82.2 04/29/2016   PLT 280 04/29/2016   Lab Results  Component Value Date   NA 137 05/09/2015   K 4.4 05/09/2015   CO2 22 05/09/2015   GLUCOSE 96 05/09/2015   BUN 11 05/09/2015   CREATININE 0.80 05/09/2015   CALCIUM 8.9 05/09/2015   ANIONGAP 7 05/09/2015      Assessment & Plan:   Problem List Items Addressed This Visit    None    Visit Diagnoses    Breast lump    -  Primary   Relevant Orders   US BREAST COMPLETE UNI LEFT INC AXILLA     Discussed the patient should follow-up for ultrasound  she should try to time this before after her next period cycle She should also avoid dehydration or caffeine   Follow up for physical exam  .  No orders of the defined types were placed in this encounter.   Follow-up: No follow-ups on file.     Doristine Bosworth, MD

## 2018-12-12 NOTE — Patient Instructions (Signed)
Breast Cyst    A breast cyst is a sac in the breast that is filled with fluid. Breast cysts are usually noncancerous (benign). They are common among women, and they are most often located in the upper, outer portion of the breast. One or more cysts may develop. They form when fluid builds up inside of the breast glands.  There are several types of breast cysts:  · Macrocyst. This is a cyst that is about 2 inches (5.1 cm) across (in diameter).  · Microcyst. This is a very small cyst that you cannot feel, but it can be seen with imaging tests such as an X-ray of the breast (mammogram) or ultrasound.  · Galactocele. This is a cyst that contains milk. It may develop if you suddenly stop breastfeeding.  Breast cysts do not increase your risk of breast cancer. They usually disappear after menopause, unless you take artificial hormones (are on hormone therapy).  What are the causes?  The exact cause of breast cysts is not known. Possible causes include:  · Blockage of tubes (ducts) in the breast glands, which leads to fluid buildup. Duct blockage may result from:  ? Fibrocystic breast changes. This is a common, benign condition that occurs when women go through hormonal changes during the menstrual cycle. This is a common cause of multiple breast cysts.  ? Overgrowth of breast tissue or breast glands.  ? Scar tissue in the breast from previous surgery.  · Changes in certain female hormones (estrogen and progesterone).  What increases the risk?  You may be more likely to develop breast cysts if you have not gone through menopause.  What are the signs or symptoms?  Symptoms of a breast cyst may include:  · Feeling one or more smooth, round, soft lumps (like grapes) in the breast that are easily moveable. The lump(s) may get bigger and more painful before your period and get smaller after your period.  · Breast discomfort or pain.  How is this diagnosed?  A cyst can be felt during a physical exam by your health care provider.  A mammogram and ultrasound will be done to confirm the diagnosis. Fluid may be removed from the cyst with a needle (fine-needle aspiration) and tested to make sure the cyst is not cancerous.  How is this treated?  Treatment may not be necessary. Your health care provider may monitor the cyst to see if it goes away on its own. If the cyst is uncomfortable or gets bigger, or if you do not like how the cyst makes your breast look, you may need treatment. Treatment may include:  · Hormone treatment.  · Fine-needle aspiration, to drain fluid from the cyst. There is a chance of the cyst coming back (recurring) after aspiration.  · Surgery to remove the cyst.  Follow these instructions at home:  · See your health care provider regularly.  ? Get a yearly physical exam.  ? If you are 20-40 years old, get a clinical breast exam every 1-3 years. After age 40, get this exam every year.  ? Get mammograms as often as directed.  · Do a breast self-exam every month, or as often as directed. Having many breast cysts, or “lumpy” breasts, may make it harder to feel for new lumps. Understand how your breasts normally look and feel, and write down any changes in your breasts so you can tell your health care provider about the changes. A breast self-exam involves:  ? Comparing your breasts in   the mirror.  ? Looking for visible changes in your skin or nipples.  ? Feeling for lumps or changes.  · Take over-the-counter and prescription medicines only as told by your health care provider.  · Wear a supportive bra, especially when exercising.  · Follow instructions from your health care provider about eating and drinking restrictions.  ? Avoid caffeine.  ? Cut down on salt (sodium) in what you eat and drink, especially before your menstrual period. Too much sodium can cause fluid buildup (retention), breast swelling, and discomfort.  · Keep all follow-up visits as told your health care provider. This is important.  Contact a health care  provider if:  · You feel, or think you feel, a lump in your breast.  · You notice that both breasts look or feel different than usual.  · Your breast is still causing pain after your menstrual period is over.  · You find new lumps or bumps that were not there before.  · You feel lumps in your armpit (axilla).  Get help right away if:  · You have severe pain, tenderness, redness, or warmth in your breast.  · You have fluid or blood leaking from your nipple.  · Your breast lump becomes hard and painful.  · You notice dimpling or wrinkling of the breast or nipple.  This information is not intended to replace advice given to you by your health care provider. Make sure you discuss any questions you have with your health care provider.  Document Released: 09/26/2005 Document Revised: 06/17/2016 Document Reviewed: 06/17/2016  Elsevier Interactive Patient Education © 2019 [employer]

## 2018-12-26 ENCOUNTER — Other Ambulatory Visit: Payer: Self-pay

## 2018-12-26 ENCOUNTER — Ambulatory Visit
Admission: RE | Admit: 2018-12-26 | Discharge: 2018-12-26 | Disposition: A | Discharge status: Home / Self Care | Payer: 59 | Source: Ambulatory Visit | Attending: Family Medicine | Admitting: Family Medicine

## 2018-12-26 DIAGNOSIS — N63 Unspecified lump in unspecified breast: Secondary | ICD-10-CM

## 2019-02-01 ENCOUNTER — Other Ambulatory Visit: Payer: Self-pay | Admitting: *Deleted

## 2019-02-01 DIAGNOSIS — Z130 Encounter for screening for diseases of the blood and blood-forming organs and certain disorders involving the immune mechanism: Secondary | ICD-10-CM

## 2019-02-01 DIAGNOSIS — Z0000 Encounter for general adult medical examination without abnormal findings: Secondary | ICD-10-CM

## 2019-02-01 DIAGNOSIS — Z1329 Encounter for screening for other suspected endocrine disorder: Secondary | ICD-10-CM

## 2019-02-01 DIAGNOSIS — Z13228 Encounter for screening for other metabolic disorders: Secondary | ICD-10-CM

## 2019-02-01 DIAGNOSIS — Z13220 Encounter for screening for lipoid disorders: Secondary | ICD-10-CM

## 2019-02-05 ENCOUNTER — Encounter: Payer: 59 | Admitting: Family Medicine

## 2019-09-17 ENCOUNTER — Other Ambulatory Visit: Payer: Self-pay

## 2019-09-17 DIAGNOSIS — Z20822 Contact with and (suspected) exposure to covid-19: Secondary | ICD-10-CM

## 2019-09-19 LAB — NOVEL CORONAVIRUS, NAA: SARS-CoV-2, NAA: NOT DETECTED

## 2019-09-25 ENCOUNTER — Ambulatory Visit: Payer: 59 | Admitting: Family Medicine

## 2019-10-09 ENCOUNTER — Encounter: Payer: Self-pay | Admitting: Family Medicine

## 2019-10-11 DIAGNOSIS — R42 Dizziness and giddiness: Secondary | ICD-10-CM

## 2019-10-11 HISTORY — DX: Dizziness and giddiness: R42

## 2019-10-14 ENCOUNTER — Encounter: Payer: Self-pay | Admitting: Family Medicine

## 2019-10-14 ENCOUNTER — Other Ambulatory Visit: Payer: Self-pay

## 2019-10-14 ENCOUNTER — Ambulatory Visit (INDEPENDENT_AMBULATORY_CARE_PROVIDER_SITE_OTHER): Payer: 59 | Admitting: Family Medicine

## 2019-10-14 VITALS — BP 117/83 | HR 85 | Temp 98.4°F | Resp 17 | Ht 60.63 in | Wt 125.4 lb

## 2019-10-14 DIAGNOSIS — A749 Chlamydial infection, unspecified: Secondary | ICD-10-CM | POA: Diagnosis not present

## 2019-10-14 DIAGNOSIS — Z23 Encounter for immunization: Secondary | ICD-10-CM | POA: Diagnosis not present

## 2019-10-14 NOTE — Patient Instructions (Signed)
° ° ° °  If you have lab work done today you will be contacted with your lab results within the next 2 weeks.  If you have not heard from us then please contact us. The fastest way to get your results is to register for My Chart. ° ° °IF you received an x-ray today, you will receive an invoice from [facility]. Please contact  Radiology at [phone] with questions or concerns regarding your invoice.  ° °IF you received labwork today, you will receive an invoice from [facility]. Please contact [facility] at [phone] with questions or concerns regarding your invoice.  ° °Our billing staff will not be able to assist you with questions regarding bills from these companies. ° °You will be contacted with the lab results as soon as they are available. The fastest way to get your results is to activate your My Chart account. Instructions are located on the last page of this paperwork. If you have not heard from us regarding the results in 2 weeks, please contact this office. °  ° ° ° °

## 2019-10-14 NOTE — Progress Notes (Signed)
Established Patient Office Visit  Subjective:  Patient ID: Allison Simmons, female    DOB: 01/09/1996  Age: 24 y.o. MRN: [id number]  CC:  Chief Complaint  Patient presents with  . Results    ob/gyn results, tetanus shot    HPI Allison Simmons presents for   Patient reports that she got her ob/gyn results and was positive for chlamydia She reports that she was told the results today by Gyne and her medication was called in   Due for tetanus today  Past Medical History:  Diagnosis Date  . Asthma   . Missed abortion 04/29/2016   Cytotec    No past surgical history on file.  No family history on file.  Social History   Socioeconomic History  . Marital status: Single    Spouse name: Not on file  . Number of children: Not on file  . Years of education: Not on file  . Highest education level: Not on file  Occupational History  . Not on file  Tobacco Use  . Smoking status: Never Smoker  . Smokeless tobacco: Never Used  Substance and Sexual Activity  . Alcohol use: Not on file  . Drug use: Not on file  . Sexual activity: Not on file  Other Topics Concern  . Not on file  Social History Narrative  . Not on file   Social Determinants of Health   Financial Resource Strain:   . Difficulty of Paying Living Expenses: Not on file  Food Insecurity:   . Worried About Programme researcher, broadcasting/film/video in the Last Year: Not on file  . Ran Out of Food in the Last Year: Not on file  Transportation Needs:   . Lack of Transportation (Medical): Not on file  . Lack of Transportation (Non-Medical): Not on file  Physical Activity:   . Days of Exercise per Week: Not on file  . Minutes of Exercise per Session: Not on file  Stress:   . Feeling of Stress : Not on file  Social Connections:   . Frequency of Communication with Friends and Family: Not on file  . Frequency of Social Gatherings with Friends and Family: Not on file  . Attends Religious Services: Not on file  . Active Member of  Clubs or Organizations: Not on file  . Attends Banker Meetings: Not on file  . Marital Status: Not on file  Intimate Partner Violence:   . Fear of Current or Ex-Partner: Not on file  . Emotionally Abused: Not on file  . Physically Abused: Not on file  . Sexually Abused: Not on file    No outpatient medications prior to visit.   No facility-administered medications prior to visit.    Allergies  Allergen Reactions  . Latex Rash    ROS Review of Systems Review of Systems  Constitutional: Negative for activity change, appetite change, chills and fever.  HENT: Negative for congestion, nosebleeds, trouble swallowing and voice change.   Respiratory: Negative for cough, shortness of breath and wheezing.   Gastrointestinal: Negative for diarrhea, nausea and vomiting.  Genitourinary: Negative for difficulty urinating, dysuria, flank pain and hematuria.  Musculoskeletal: Negative for back pain, joint swelling and neck pain.  Neurological: Negative for dizziness, speech difficulty, light-headedness and numbness.  See HPI. All other review of systems negative.     Objective:    Physical Exam  BP 117/83 (BP Location: Right Arm, Patient Position: Sitting, Cuff Size: Normal)   Pulse 85  Temp 98.4 F (36.9 C) (Oral)   Resp 17   Ht 5' 0.63" (1.54 m)   Wt 125 lb 6.4 oz (56.9 kg)   LMP 09/26/2019   SpO2 100%   BMI 23.98 kg/m  Wt Readings from Last 3 Encounters:  10/14/19 125 lb 6.4 oz (56.9 kg)  12/12/18 109 lb (49.4 kg)  04/29/16 108 lb 4.8 oz (49.1 kg) (12 %, Z= -1.16)*   * Growth percentiles are based on CDC (Girls, 2-20 Years) data.   Physical Exam  Constitutional: Oriented to person, place, and time. Appears well-developed and well-nourished.  HENT:  Head: Normocephalic and atraumatic.  Eyes: Conjunctivae and EOM are normal.  Cardiovascular: Normal rate, regular rhythm, normal heart sounds and intact distal pulses.  No murmur heard. Pulmonary/Chest:  Effort normal and breath sounds normal. No stridor. No respiratory distress. Has no wheezes.  Neurological: Is alert and oriented to person, place, and time.  Skin: Skin is warm. Capillary refill takes less than 2 seconds.  Psychiatric: Has a normal mood and affect. Behavior is normal. Judgment and thought content normal.    Health Maintenance Due  Topic Date Due  . HIV Screening  06/21/2011  . PAP-Cervical Cytology Screening  06/20/2017  . PAP SMEAR-Modifier  06/20/2017    There are no preventive care reminders to display for this patient.  No results found for: TSH Lab Results  Component Value Date   WBC 5.7 04/29/2016   HGB 12.2 04/29/2016   HCT 37.4 04/29/2016   MCV 82.2 04/29/2016   PLT 280 04/29/2016   Lab Results  Component Value Date   NA 137 05/09/2015   K 4.4 05/09/2015   CO2 22 05/09/2015   GLUCOSE 96 05/09/2015   BUN 11 05/09/2015   CREATININE 0.80 05/09/2015   CALCIUM 8.9 05/09/2015   ANIONGAP 7 05/09/2015    Assessment & Plan:   Problem List Items Addressed This Visit    None    Visit Diagnoses    Chlamydia infection    -  Primary -  Treated by Allison Simmons -   Advised condom useage    Need for Tdap vaccination       Relevant Orders   Tdap vaccine greater than or equal to 7yo IM (Completed)      No orders of the defined types were placed in this encounter.   Follow-up: No follow-ups on file.    Allison Moron, MD

## 2019-11-06 ENCOUNTER — Encounter: Payer: 59 | Admitting: Family Medicine

## 2020-04-15 ENCOUNTER — Telehealth: Payer: Self-pay | Admitting: Family Medicine

## 2020-04-15 NOTE — Telephone Encounter (Signed)
Called pt LVM for her to call back and schedule appt per CRM

## 2020-04-23 ENCOUNTER — Emergency Department (HOSPITAL_COMMUNITY)
Admission: EM | Admit: 2020-04-23 | Discharge: 2020-04-23 | Disposition: A | Discharge status: Home / Self Care | Payer: 59 | Attending: Emergency Medicine | Admitting: Emergency Medicine

## 2020-04-23 ENCOUNTER — Encounter (HOSPITAL_COMMUNITY): Payer: Self-pay | Admitting: Obstetrics and Gynecology

## 2020-04-23 DIAGNOSIS — R42 Dizziness and giddiness: Secondary | ICD-10-CM | POA: Insufficient documentation

## 2020-04-23 DIAGNOSIS — Z5321 Procedure and treatment not carried out due to patient leaving prior to being seen by health care provider: Secondary | ICD-10-CM | POA: Insufficient documentation

## 2020-04-23 DIAGNOSIS — R110 Nausea: Secondary | ICD-10-CM | POA: Insufficient documentation

## 2020-04-23 DIAGNOSIS — R519 Headache, unspecified: Secondary | ICD-10-CM | POA: Insufficient documentation

## 2020-04-23 LAB — CBC
HCT: 40.9 % (ref 36.0–46.0)
Hemoglobin: 12.5 g/dL (ref 12.0–15.0)
MCH: 25.1 pg — ABNORMAL LOW (ref 26.0–34.0)
MCHC: 30.6 g/dL (ref 30.0–36.0)
MCV: 82 fL (ref 80.0–100.0)
Platelets: 372 10*3/uL (ref 150–400)
RBC: 4.99 MIL/uL (ref 3.87–5.11)
RDW: 16.7 % — ABNORMAL HIGH (ref 11.5–15.5)
WBC: 9 10*3/uL (ref 4.0–10.5)
nRBC: 0 % (ref 0.0–0.2)

## 2020-04-23 LAB — I-STAT BETA HCG BLOOD, ED (MC, WL, AP ONLY): I-stat hCG, quantitative: <5 m[IU]/mL (ref <5)

## 2020-04-23 LAB — BASIC METABOLIC PANEL
Anion gap: 9 (ref 5–15)
BUN: 10 mg/dL (ref 6–20)
CO2: 25 mmol/L (ref 22–32)
Calcium: 9.5 mg/dL (ref 8.9–10.3)
Chloride: 105 mmol/L (ref 98–111)
Creatinine, Ser: 0.71 mg/dL (ref 0.44–1.00)
GFR calc Af Amer: >60 mL/min (ref >60)
GFR calc non Af Amer: >60 mL/min (ref >60)
Glucose, Bld: 96 mg/dL (ref 70–99)
Potassium: 4.4 mmol/L (ref 3.5–5.1)
Sodium: 139 mmol/L (ref 135–145)

## 2020-04-23 MED ORDER — SODIUM CHLORIDE 0.9% FLUSH: 3.0000 mL | Freq: Once | INTRAVENOUS | Status: DC

## 2020-04-23 NOTE — ED Triage Notes (Signed)
Patient reports she is dizzy and has a headache, nausea, and was seen by her PCP, but they left the practice and has not been able to get back in as she was told she is no longer a patient there.

## 2020-04-27 ENCOUNTER — Other Ambulatory Visit: Payer: Self-pay | Admitting: Obstetrics

## 2020-05-11 ENCOUNTER — Encounter: Payer: Self-pay | Admitting: Family Medicine

## 2020-05-11 LAB — HM PAP SMEAR: HM Pap smear: NORMAL

## 2020-06-09 ENCOUNTER — Telehealth: Payer: Self-pay | Admitting: Family Medicine

## 2020-06-09 NOTE — Telephone Encounter (Signed)
Pt is wanting her medical records released to another clinic. She left a vm on medical records answering machine. I will need a verbal auth or for her to fill out a medical release. Her phone number goes straight to an answering machine.

## 2021-08-08 ENCOUNTER — Encounter (HOSPITAL_COMMUNITY): Payer: Self-pay | Admitting: Emergency Medicine

## 2021-08-08 ENCOUNTER — Emergency Department (HOSPITAL_COMMUNITY)
Admission: EM | Admit: 2021-08-08 | Discharge: 2021-08-09 | Disposition: A | Discharge status: Home / Self Care | Payer: Self-pay | Attending: Emergency Medicine | Admitting: Emergency Medicine

## 2021-08-08 ENCOUNTER — Other Ambulatory Visit: Payer: Self-pay

## 2021-08-08 DIAGNOSIS — Z20822 Contact with and (suspected) exposure to covid-19: Secondary | ICD-10-CM | POA: Insufficient documentation

## 2021-08-08 DIAGNOSIS — J029 Acute pharyngitis, unspecified: Secondary | ICD-10-CM | POA: Insufficient documentation

## 2021-08-08 DIAGNOSIS — R509 Fever, unspecified: Secondary | ICD-10-CM | POA: Insufficient documentation

## 2021-08-08 DIAGNOSIS — J1089 Influenza due to other identified influenza virus with other manifestations: Secondary | ICD-10-CM | POA: Insufficient documentation

## 2021-08-08 DIAGNOSIS — Z5321 Procedure and treatment not carried out due to patient leaving prior to being seen by health care provider: Secondary | ICD-10-CM | POA: Insufficient documentation

## 2021-08-08 MED ORDER — ACETAMINOPHEN 325 MG PO TABS
650.0000 mg | ORAL_TABLET | Freq: Once | ORAL | Status: AC
  Administered 2021-08-08: 650 mg via ORAL
  Filled 2021-08-08: qty 2

## 2021-08-08 NOTE — ED Provider Notes (Signed)
Emergency Medicine Provider Triage Evaluation Note  Allison Simmons , a 25 y.o. female  was evaluated in triage.  Pt complains of sore throat.  That started today. She has been vomiting.  The sore throat started first.  She has been coughing.  She is not vaccinated against flu or COVID.  She reports body aches. No known sick contacts.   Review of Systems  Positive: Cough, fever, sore throat, N/V/D Negative: Shortness of breath  Physical Exam  BP 113/69 (BP Location: Right Arm)   Pulse (!) 118   Temp (!) 102.6 F (39.2 C) (Oral)   Resp 20   SpO2 100%  Gen:   Awake, no distress   Resp:  Normal effort  MSK:   Moves extremities without difficulty  Other:  Normal speech.  Mild tonsilar enlargement.   Medical Decision Making  Medically screening exam initiated at 11:34 PM.  Appropriate orders placed.  Allison Simmons was informed that the remainder of the evaluation will be completed by another provider, this initial triage assessment does not replace that evaluation, and the importance of remaining in the ED until their evaluation is complete.  Note: Portions of this report may have been transcribed using voice recognition software. Every effort was made to ensure accuracy; however, inadvertent computerized transcription errors may be present    Allison Simmons 08/08/21 Allison Ac[place], MD 08/10/21 [phone]

## 2021-08-08 NOTE — ED Triage Notes (Signed)
Patient reports sore throat with fever/chills today , no cough , respirations unlabored.

## 2021-08-09 ENCOUNTER — Emergency Department (HOSPITAL_COMMUNITY): Payer: Self-pay

## 2021-08-09 LAB — CBC WITH DIFFERENTIAL/PLATELET
Abs Immature Granulocytes: 0.02 10*3/uL (ref 0.00–0.07)
Basophils Absolute: 0 10*3/uL (ref 0.0–0.1)
Basophils Relative: 0 %
Eosinophils Absolute: 0 10*3/uL (ref 0.0–0.5)
Eosinophils Relative: 0 %
HCT: 31.9 % — ABNORMAL LOW (ref 36.0–46.0)
Hemoglobin: 9.2 g/dL — ABNORMAL LOW (ref 12.0–15.0)
Immature Granulocytes: 0 %
Lymphocytes Relative: 3 %
Lymphs Abs: 0.2 10*3/uL — ABNORMAL LOW (ref 0.7–4.0)
MCH: 20.1 pg — ABNORMAL LOW (ref 26.0–34.0)
MCHC: 28.8 g/dL — ABNORMAL LOW (ref 30.0–36.0)
MCV: 69.8 fL — ABNORMAL LOW (ref 80.0–100.0)
Monocytes Absolute: 0.6 10*3/uL (ref 0.1–1.0)
Monocytes Relative: 6 %
Neutro Abs: 8.9 10*3/uL — ABNORMAL HIGH (ref 1.7–7.7)
Neutrophils Relative %: 91 %
Platelets: 355 10*3/uL (ref 150–400)
RBC: 4.57 MIL/uL (ref 3.87–5.11)
RDW: 19.4 % — ABNORMAL HIGH (ref 11.5–15.5)
WBC: 9.8 10*3/uL (ref 4.0–10.5)
nRBC: 0 % (ref 0.0–0.2)

## 2021-08-09 LAB — COMPREHENSIVE METABOLIC PANEL
ALT: 12 U/L (ref 0–44)
AST: 21 U/L (ref 15–41)
Albumin: 4.1 g/dL (ref 3.5–5.0)
Alkaline Phosphatase: 55 U/L (ref 38–126)
Anion gap: 11 (ref 5–15)
BUN: 8 mg/dL (ref 6–20)
CO2: 20 mmol/L — ABNORMAL LOW (ref 22–32)
Calcium: 9.1 mg/dL (ref 8.9–10.3)
Chloride: 102 mmol/L (ref 98–111)
Creatinine, Ser: 0.84 mg/dL (ref 0.44–1.00)
GFR, Estimated: >60 mL/min (ref >60)
Glucose, Bld: 100 mg/dL — ABNORMAL HIGH (ref 70–99)
Potassium: 3.8 mmol/L (ref 3.5–5.1)
Sodium: 133 mmol/L — ABNORMAL LOW (ref 135–145)
Total Bilirubin: 0.8 mg/dL (ref 0.3–1.2)
Total Protein: 7.9 g/dL (ref 6.5–8.1)

## 2021-08-09 LAB — RESP PANEL BY RT-PCR (FLU A&B, COVID) ARPGX2
Influenza A by PCR: POSITIVE — AB
Influenza B by PCR: NEGATIVE
SARS Coronavirus 2 by RT PCR: NEGATIVE

## 2021-08-09 LAB — I-STAT BETA HCG BLOOD, ED (MC, WL, AP ONLY): I-stat hCG, quantitative: <5 m[IU]/mL (ref <5)

## 2021-08-09 LAB — LIPASE, BLOOD: Lipase: 24 U/L (ref 11–51)

## 2021-08-09 MED ORDER — IBUPROFEN 400 MG PO TABS
400.0000 mg | ORAL_TABLET | Freq: Once | ORAL | Status: AC
  Administered 2021-08-09: 400 mg via ORAL
  Filled 2021-08-09: qty 1

## 2021-08-09 NOTE — ED Notes (Signed)
Pt decided to leave due to wait 

## 2022-02-10 ENCOUNTER — Other Ambulatory Visit (HOSPITAL_COMMUNITY)
Admission: RE | Admit: 2022-02-10 | Discharge: 2022-02-10 | Disposition: A | Discharge status: Home / Self Care | Payer: 59 | Source: Ambulatory Visit | Attending: Obstetrics and Gynecology | Admitting: Obstetrics and Gynecology

## 2022-02-10 ENCOUNTER — Encounter: Payer: Self-pay | Admitting: Obstetrics and Gynecology

## 2022-02-10 ENCOUNTER — Ambulatory Visit (INDEPENDENT_AMBULATORY_CARE_PROVIDER_SITE_OTHER): Payer: 59 | Admitting: Obstetrics and Gynecology

## 2022-02-10 VITALS — BP 103/67 | HR 94 | Wt 104.5 lb

## 2022-02-10 DIAGNOSIS — Z01419 Encounter for gynecological examination (general) (routine) without abnormal findings: Secondary | ICD-10-CM | POA: Diagnosis present

## 2022-02-10 DIAGNOSIS — Z113 Encounter for screening for infections with a predominantly sexual mode of transmission: Secondary | ICD-10-CM | POA: Insufficient documentation

## 2022-02-10 NOTE — Progress Notes (Addendum)
? ? ?GYNECOLOGY ANNUAL PREVENTATIVE CARE ENCOUNTER NOTE ? ?History:    ? JADINE BRUMLEY is a 26 y.o. G75P0010 female here for a routine annual gynecologic exam.  Current complaints: none.   Denies abnormal vaginal bleeding, discharge, pelvic pain, problems with intercourse or other gynecologic concerns.  ?  ?Gynecologic History ?Patient's last menstrual period was 02/02/2022. ?Contraception: none ?Last Pap: 1 year ago.  ?Obstetric History ?OB History  ?Gravida Para Term Preterm AB Living  ?1       1 0  ?SAB IAB Ectopic Multiple Live Births  ?1          ?  ?# Outcome Date GA Lbr Len/2nd Weight Sex Delivery Anes PTL Lv  ?1 SAB 2017 [redacted]w[redacted]d         ? ? ?Past Medical History:  ?Diagnosis Date  ? Asthma   ? Missed abortion 04/29/2016  ? Cytotec  ? Vertigo 2021  ? ? ?History reviewed. No pertinent surgical history. ? ?No current outpatient medications on file prior to visit.  ? ?No current facility-administered medications on file prior to visit.  ? ? ?Allergies  ?Allergen Reactions  ? Latex Rash  ? ? ?Social History:  reports that she has never smoked. She has never used smokeless tobacco. She reports that she does not currently use alcohol. She reports that she does not currently use drugs. ? ?No family history on file. ? ?The following portions of the patient's history were reviewed and updated as appropriate: allergies, current medications, past family history, past medical history, past social history, past surgical history and problem list. ? ?Review of Systems ?Pertinent items noted in HPI and remainder of comprehensive ROS otherwise negative. ? ?Physical Exam:  ?BP 103/67   Pulse 94   Wt 47.4 kg   LMP 02/02/2022   BMI 20.24 kg/m?  ?CONSTITUTIONAL: Well-developed, well-nourished female in no acute distress.  ?HENT:  Normocephalic, atraumatic, External right and left ear normal. Oropharynx is clear and moist ?EYES: Conjunctivae and EOM are normal.  ?NECK: Normal range of motion, supple, no masses.  Normal  thyroid.  ?SKIN: Skin is warm and dry. No rash noted. Not diaphoretic. No erythema. No pallor. ?MUSCULOSKELETAL: Normal range of motion. No tenderness.  No cyanosis, clubbing, or edema.  2+ distal pulses. ?NEUROLOGIC: Alert and oriented to person, place, and time. Normal reflexes, muscle tone coordination.  ?PSYCHIATRIC: Normal mood and affect. Normal behavior. Normal judgment and thought content. ?CARDIOVASCULAR: Normal heart rate noted, regular rhythm ?RESPIRATORY: Clear to auscultation bilaterally. Effort and breath sounds normal, no problems with respiration noted. ?BREASTS: Symmetric in size. No masses, tenderness, skin changes, nipple drainage, or lymphadenopathy bilaterally. Performed in the presence of a chaperone. ?ABDOMEN: Soft, no distention noted.  No tenderness, rebound or guarding.  ?PELVIC: Normal appearing external genitalia and urethral meatus; normal appearing vaginal mucosa and cervix.  No abnormal discharge noted.  Pap smear obtained.  Normal uterine size, no other palpable masses, no uterine or adnexal tenderness. Uterus retroflexed. Vaginal swab taken Performed in the presence of a chaperone. ?  ?Assessment and Plan:  ?  1. Women's annual routine gynecological examination ?Normal annual exam ?Pt declined birth control options ?- Cytology - PAP( [place]) ? ?2. Routine screening for STI (sexually transmitted infection) ?Per pt request ?- Cervicovaginal ancillary only( [place]) ?- HIV antibody (with reflex) ?- RPR ?- Hepatitis B Surface AntiGEN ?- Hepatitis C Antibody ? ?Will follow up results of pap smear and manage accordingly. ?Routine preventative health maintenance measures emphasized. ?  Please refer to After Visit Summary for other counseling recommendations.  ?  F/u in 1 year or prn ? ?Mariel Aloe, MD, FACOG ?Obstetrician Heritage manager, Faculty Practice ?Center for [employer], [facility]  ?

## 2022-02-10 NOTE — Progress Notes (Signed)
Pt is in the office for annual. ?Last pap over a year ago at an outside clinic ?LMP 02/02/22 ?Pt desires full STD screening ?

## 2022-02-11 ENCOUNTER — Encounter: Payer: Self-pay | Admitting: Obstetrics and Gynecology

## 2022-02-11 LAB — CERVICOVAGINAL ANCILLARY ONLY
Bacterial Vaginitis (gardnerella): NEGATIVE
Candida Glabrata: NEGATIVE
Candida Vaginitis: NEGATIVE
Chlamydia: NEGATIVE
Comment: NEGATIVE
Comment: NEGATIVE
Comment: NEGATIVE
Comment: NEGATIVE
Comment: NEGATIVE
Comment: NORMAL
Neisseria Gonorrhea: NEGATIVE
Trichomonas: NEGATIVE

## 2022-02-11 LAB — HEPATITIS C ANTIBODY: Hep C Virus Ab: NONREACTIVE

## 2022-02-11 LAB — HIV ANTIBODY (ROUTINE TESTING W REFLEX): HIV Screen 4th Generation wRfx: NONREACTIVE

## 2022-02-11 LAB — HEPATITIS B SURFACE ANTIGEN: Hepatitis B Surface Ag: NEGATIVE

## 2022-02-11 LAB — RPR: RPR Ser Ql: NONREACTIVE

## 2022-02-14 ENCOUNTER — Encounter: Payer: Self-pay | Admitting: Obstetrics and Gynecology

## 2022-02-14 LAB — CYTOLOGY - PAP
Comment: NEGATIVE
High risk HPV: POSITIVE — AB

## 2022-02-16 ENCOUNTER — Telehealth: Payer: Self-pay

## 2022-02-16 NOTE — Telephone Encounter (Signed)
Called to advise of results and need for colpo, no answer, left vm °

## 2022-02-16 NOTE — Telephone Encounter (Signed)
S/w pt about results and pt is scheduled for colpo. ?

## 2022-02-18 ENCOUNTER — Ambulatory Visit: Payer: 59

## 2022-02-24 ENCOUNTER — Ambulatory Visit (INDEPENDENT_AMBULATORY_CARE_PROVIDER_SITE_OTHER): Payer: 59 | Admitting: Obstetrics and Gynecology

## 2022-02-24 ENCOUNTER — Encounter: Payer: Self-pay | Admitting: Obstetrics and Gynecology

## 2022-02-24 VITALS — BP 96/58 | HR 70 | Resp 12 | Ht 61.0 in | Wt 104.0 lb

## 2022-02-24 DIAGNOSIS — R87810 Cervical high risk human papillomavirus (HPV) DNA test positive: Secondary | ICD-10-CM

## 2022-02-24 DIAGNOSIS — R85612 Low grade squamous intraepithelial lesion on cytologic smear of anus (LGSIL): Secondary | ICD-10-CM

## 2022-02-24 DIAGNOSIS — R87612 Low grade squamous intraepithelial lesion on cytologic smear of cervix (LGSIL): Secondary | ICD-10-CM

## 2022-02-24 NOTE — Patient Instructions (Signed)
Colposcopy  Colposcopy is a procedure to examine the lowest part of the uterus (cervix) for abnormalities or signs of disease. This procedure is done using an instrument that makes objects appear larger and provides light (colposcope). During the procedure, your health care provider may remove a tissue sample to look at under a microscope (biopsy). A biopsy may be done if any unusual cells are seen during the colposcopy. You may have a colposcopy if you have: An abnormal Pap smear, also called a Pap test. This screening test is used to check for signs of cancer or infection of the vagina, cervix, and uterus. An HPV (human papillomavirus) test and get a positive result for a type of HPV that puts you at high risk of cancer. Certain conditions or symptoms, such as: A sore, or lesion, on your cervix. Genital warts on your vulva, vagina, or cervix. Pain during sex. Vaginal bleeding, especially after sex. A growth on your cervix (cervical polyp) that needs to be removed. Let your health care provider know about: Any allergies you have, including allergies to medicines, latex, or iodine. All medicines you are taking, including vitamins, herbs, eye drops, creams, and over-the-counter medicines. Any bleeding problems you have. Any surgeries you have had. Any medical conditions you have, such as pelvic inflammatory disease (PID) or an endometrial disorder. The pattern of your menstrual cycles and the form of birth control (contraception) you use, if any. Your medical history, including any cervical treatments and how well you tolerated the procedure (if you have ever fainted). Whether you are pregnant or may be pregnant. What are the risks? Generally, this is a safe procedure. However, problems may occur, including: Infection. Symptoms of infection may include fever, bad-smelling vaginal discharge, or pelvic pain. Vaginal bleeding. Allergic reactions to medicines. Damage to nearby structures or  organs. What happens before the procedure? Medicines Ask your health care provider about: Changing or stopping your regular medicines. This is especially important if you are taking diabetes medicines or blood thinners. Taking medicines such as aspirin and ibuprofen. These medicines can thin your blood. Do not take these medicines unless your health care provider tells you to take them. Your health care provider will likely tell you to avoid taking aspirin, or medicine that contains aspirin, for 7 days before the procedure. Taking over-the-counter medicines, vitamins, herbs, and supplements. General instructions Tell your health care provider if you have your menstrual period now or will have it at the time of your procedure. A colposcopy is not normally done during your menstrual period. If you use contraception, continue to use it before your procedure. For 24 hours before the procedure: Do not use douche products or tampons. Do not use medicines, creams, or suppositories in the vagina. Do not have sex or insert anything into your vagina. Ask your health care provider what steps will be taken to prevent infection. What happens during the procedure? You will lie down on your back, with your feet in foot rests (stirrups). An instrument called a speculum will be inserted into your vagina. This will be used so your health care provider can see your cervix and the inside of your vagina. A cotton swab will be used to place a small amount of a liquid (solution) on the areas to be examined. This solution makes it easier to see abnormal cells. You may feel a slight burning during this part. The colposcope will be used to scan the cervix with a bright white light. The colposcope will be held near   your vulva and will make your vulva, vagina, and cervix look bigger so they can be seen better. If a biopsy is needed: You may be given a medicine to numb the area (local anesthetic). Surgical tools will be  used to remove mucus and cells through your vagina. You may feel mild pain while the tissue sample is removed. Bleeding may occur. A solution may be used to stop the bleeding. The tissue removed will be sent to a lab to be looked at under a microscope. The procedure may vary among health care providers and hospitals. What happens after the procedure? You may have some cramping in your abdomen. This should go away after a few minutes. It is up to you to get the results of your procedure. Ask your health care provider, or the department that is doing the procedure, when your results will be ready. Summary Colposcopy is a procedure to examine the lowest part of the uterus (cervix), for signs of disease. A biopsy may be done as part of the procedure. You may have some cramping in your abdomen. This should go away after a few minutes. It is up to you to get the results of your procedure. Ask your health care provider, or the department that is doing the procedure, when your results will be ready. This information is not intended to replace advice given to you by your health care provider. Make sure you discuss any questions you have with your health care provider. Document Revised: 02/21/2021 Document Reviewed: 02/21/2021 Elsevier Patient Education  2023 [employer]  

## 2022-02-24 NOTE — Progress Notes (Signed)
GYNECOLOGY  VISIT   HPI: 26 y.o.   Single  African American  female   G1P0010 with Patient's last menstrual period was 02/02/2022.   here as new patient to discuss having a colposcopy sooner that 03-28-22 with Dr. Donavan Foil    Had Gardasil vaccine in past and had all three vaccine.   Menses every 21 days.   Negative GC/CT/trich/BV/yeast at the time of her pap smear. Steady partner.   Outside pap and HR HPV reviewed.   GYNECOLOGIC HISTORY: Patient's last menstrual period was 02/02/2022. Contraception:  condoms Menopausal hormone therapy:  none Last mammogram:  12-26-2018 BIRADS 2 benign  Last pap smear:   02-10-22 LSIL, HR HPV +                              05-11-20 normal                              10-08-2019 normal         OB History     Gravida  1   Para      Term      Preterm      AB  1   Living  0      SAB  1   IAB      Ectopic      Multiple      Live Births                 Patient Active Problem List   Diagnosis Date Noted   Missed abortion 04/29/2016    Past Medical History:  Diagnosis Date   Asthma    Missed abortion 04/29/2016   Cytotec   Vertigo 2021    History reviewed. No pertinent surgical history.  No current outpatient medications on file.   No current facility-administered medications for this visit.     ALLERGIES: Latex  History reviewed. No pertinent family history.  Social History   Socioeconomic History   Marital status: Single    Spouse name: Not on file   Number of children: Not on file   Years of education: Not on file   Highest education level: Not on file  Occupational History   Not on file  Tobacco Use   Smoking status: Never   Smokeless tobacco: Never  Vaping Use   Vaping Use: Never used  Substance and Sexual Activity   Alcohol use: Yes    Comment: occasional   Drug use: Not Currently   Sexual activity: Not Currently    Birth control/protection: Condom  Other Topics Concern   Not on file  Social  History Narrative   Not on file   Social Determinants of Health   Financial Resource Strain: Not on file  Food Insecurity: Not on file  Transportation Needs: Not on file  Physical Activity: Not on file  Stress: Not on file  Social Connections: Not on file  Intimate Partner Violence: Not on file    Review of Systems  All other systems reviewed and are negative.  PHYSICAL EXAMINATION:    BP (!) 96/58 (BP Location: Left Arm, Patient Position: Sitting, Cuff Size: Normal)   Pulse 70   Resp 12   Ht 5\' 1"  (1.549 m)   Wt 104 lb (47.2 kg)   LMP 02/02/2022   BMI 19.65 kg/m     General appearance: alert, cooperative and appears stated  age Head: Normocephalic, without obvious abnormality, atraumatic Neck: no adenopathy. Lungs: clear to auscultation bilaterally Heart: regular rate and rhythm Abdomen: soft, non-tender, no masses,  no organomegaly No abnormal inguinal nodes palpated  Pelvic: External genitalia:  no lesions              Urethra:  normal appearing urethra with no masses, tenderness or lesions              Bartholins and Skenes: normal                 Vagina: small 4 mm raised white area of left vaginal apex.              Cervix: no lesions                Bimanual Exam:  Uterus:  normal size, contour, position, consistency, mobility, non-tender              Adnexa: no mass, fullness, tenderness               Chaperone was present for exam:  Irving Burton, RN  ASSESSMENT  LGSIL pap with positive HR HPV.  Possible vaginal lesion.  PLAN  We discussed abnormal paps, HPV, colposcopy and LEEP.  She will return for colposcopy and biopsy.  Procedure explained.    An After Visit Summary was printed and given to the patient.

## 2022-02-28 ENCOUNTER — Other Ambulatory Visit: Payer: Self-pay | Admitting: *Deleted

## 2022-02-28 DIAGNOSIS — R87612 Low grade squamous intraepithelial lesion on cytologic smear of cervix (LGSIL): Secondary | ICD-10-CM

## 2022-03-11 ENCOUNTER — Ambulatory Visit: Payer: 59 | Admitting: Obstetrics and Gynecology

## 2022-03-28 ENCOUNTER — Ambulatory Visit (INDEPENDENT_AMBULATORY_CARE_PROVIDER_SITE_OTHER): Payer: 59 | Admitting: Obstetrics and Gynecology

## 2022-03-28 ENCOUNTER — Encounter: Payer: Self-pay | Admitting: Obstetrics and Gynecology

## 2022-03-28 ENCOUNTER — Other Ambulatory Visit (HOSPITAL_COMMUNITY)
Admission: RE | Admit: 2022-03-28 | Discharge: 2022-03-28 | Disposition: A | Discharge status: Home / Self Care | Payer: 59 | Source: Ambulatory Visit | Attending: Obstetrics and Gynecology | Admitting: Obstetrics and Gynecology

## 2022-03-28 DIAGNOSIS — N870 Mild cervical dysplasia: Secondary | ICD-10-CM

## 2022-03-28 NOTE — Progress Notes (Signed)
Patient given informed consent, signed copy in the chart, time out was performed.  Placed in lithotomy position. Reviewed pap, CIN 1 noted.  Negative UPT noted.  Cervix viewed with speculum and colposcope after application of acetic acid.   Colposcopy adequate?  yes Acetowhite lesions? Yes , band from 11 oclock to 5 oclock circumferentially around cervix Punctation? none Mosaicism?   none Abnormal vasculature?  none Biopsies? 2 at 11 and 6 o'clock ECC? No  Visually consistent with CIN 1-2 , pathology pending  COMMENTS: Patient was given post procedure instructions.    Warden Fillers, MD

## 2022-03-30 LAB — SURGICAL PATHOLOGY

## 2023-03-01 IMAGING — DX DG CHEST 2V
2 series · 2 of 2 positions shown · non-contrast
Comparison: 02/11/2014

CLINICAL DATA: Dyspnea

EXAM:
CHEST - 2 VIEW

[chest pa]
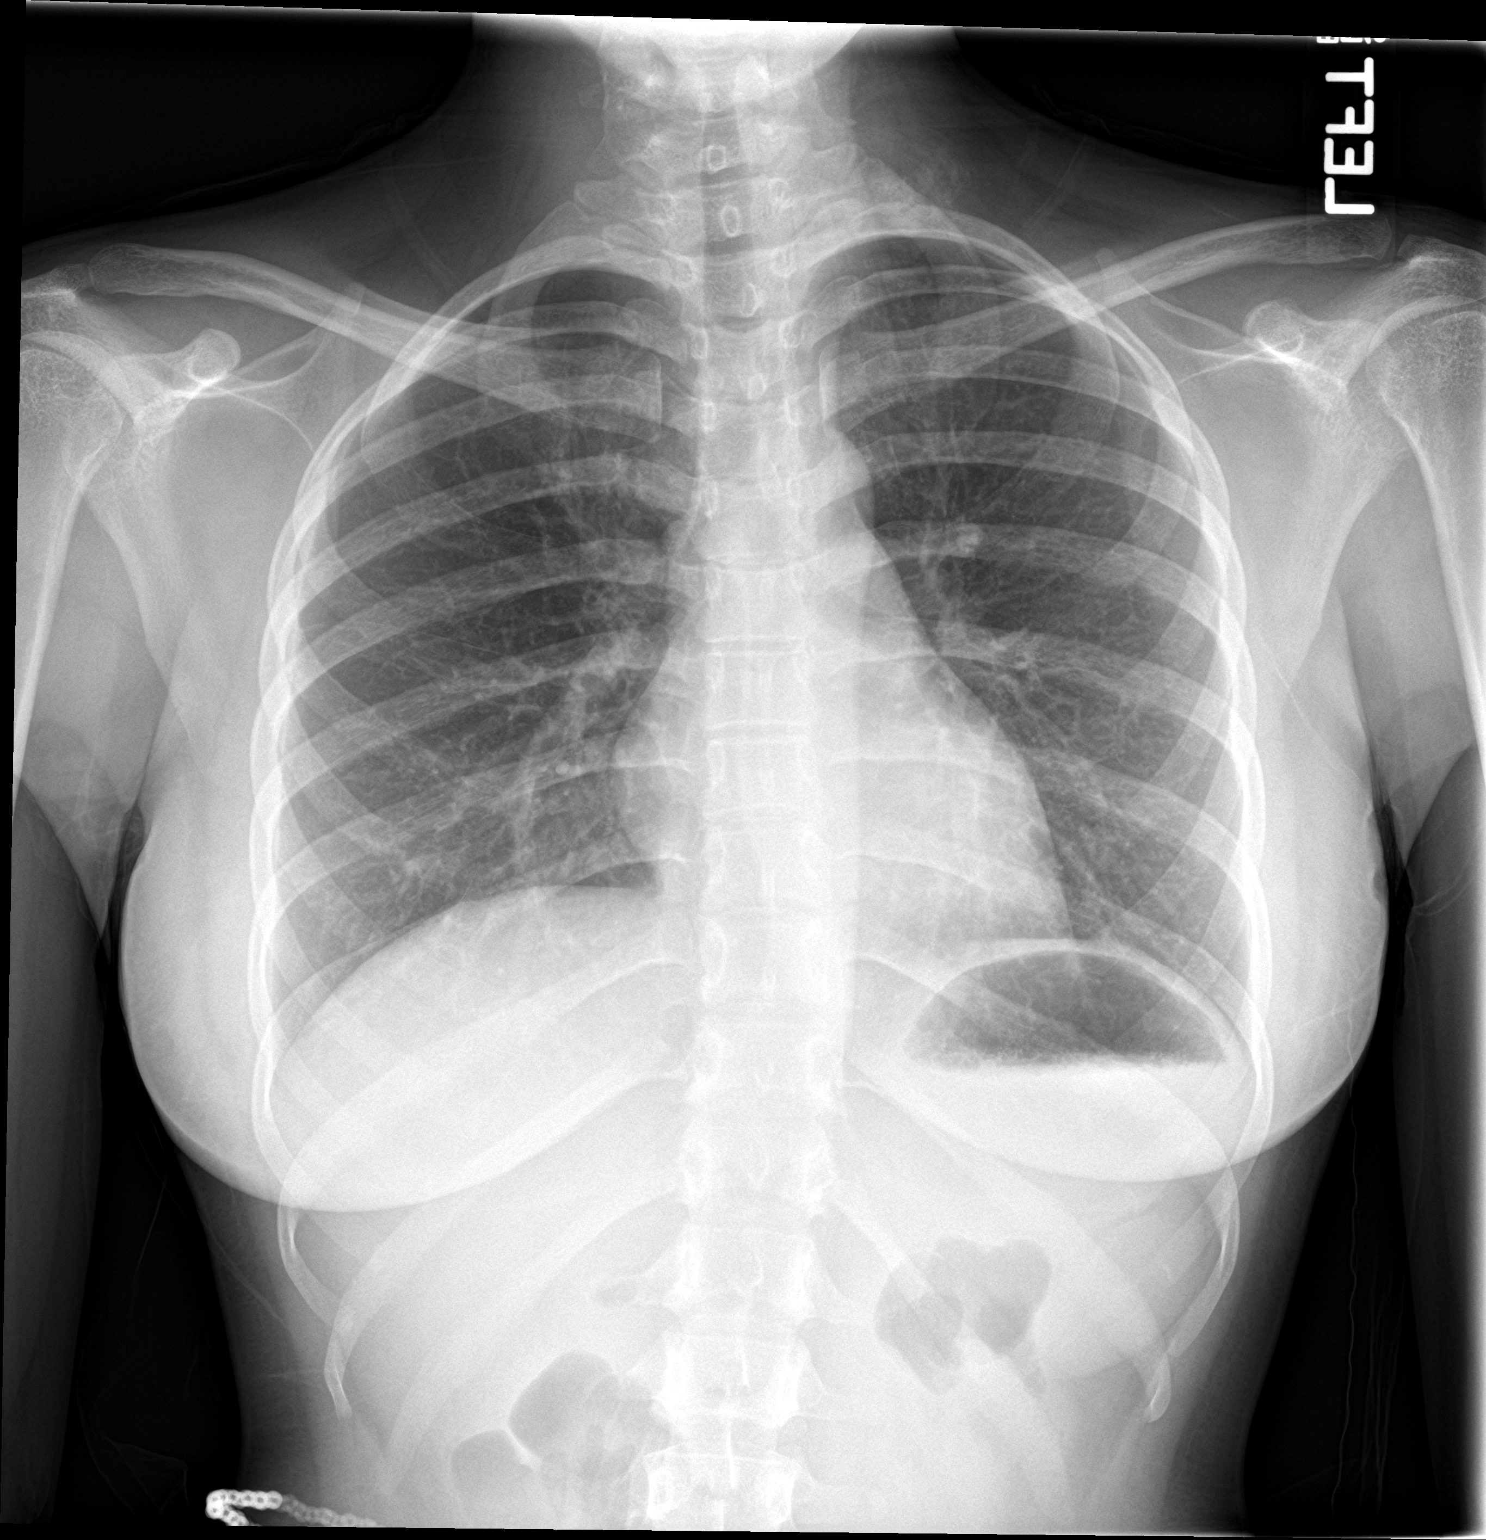

[chest lat]
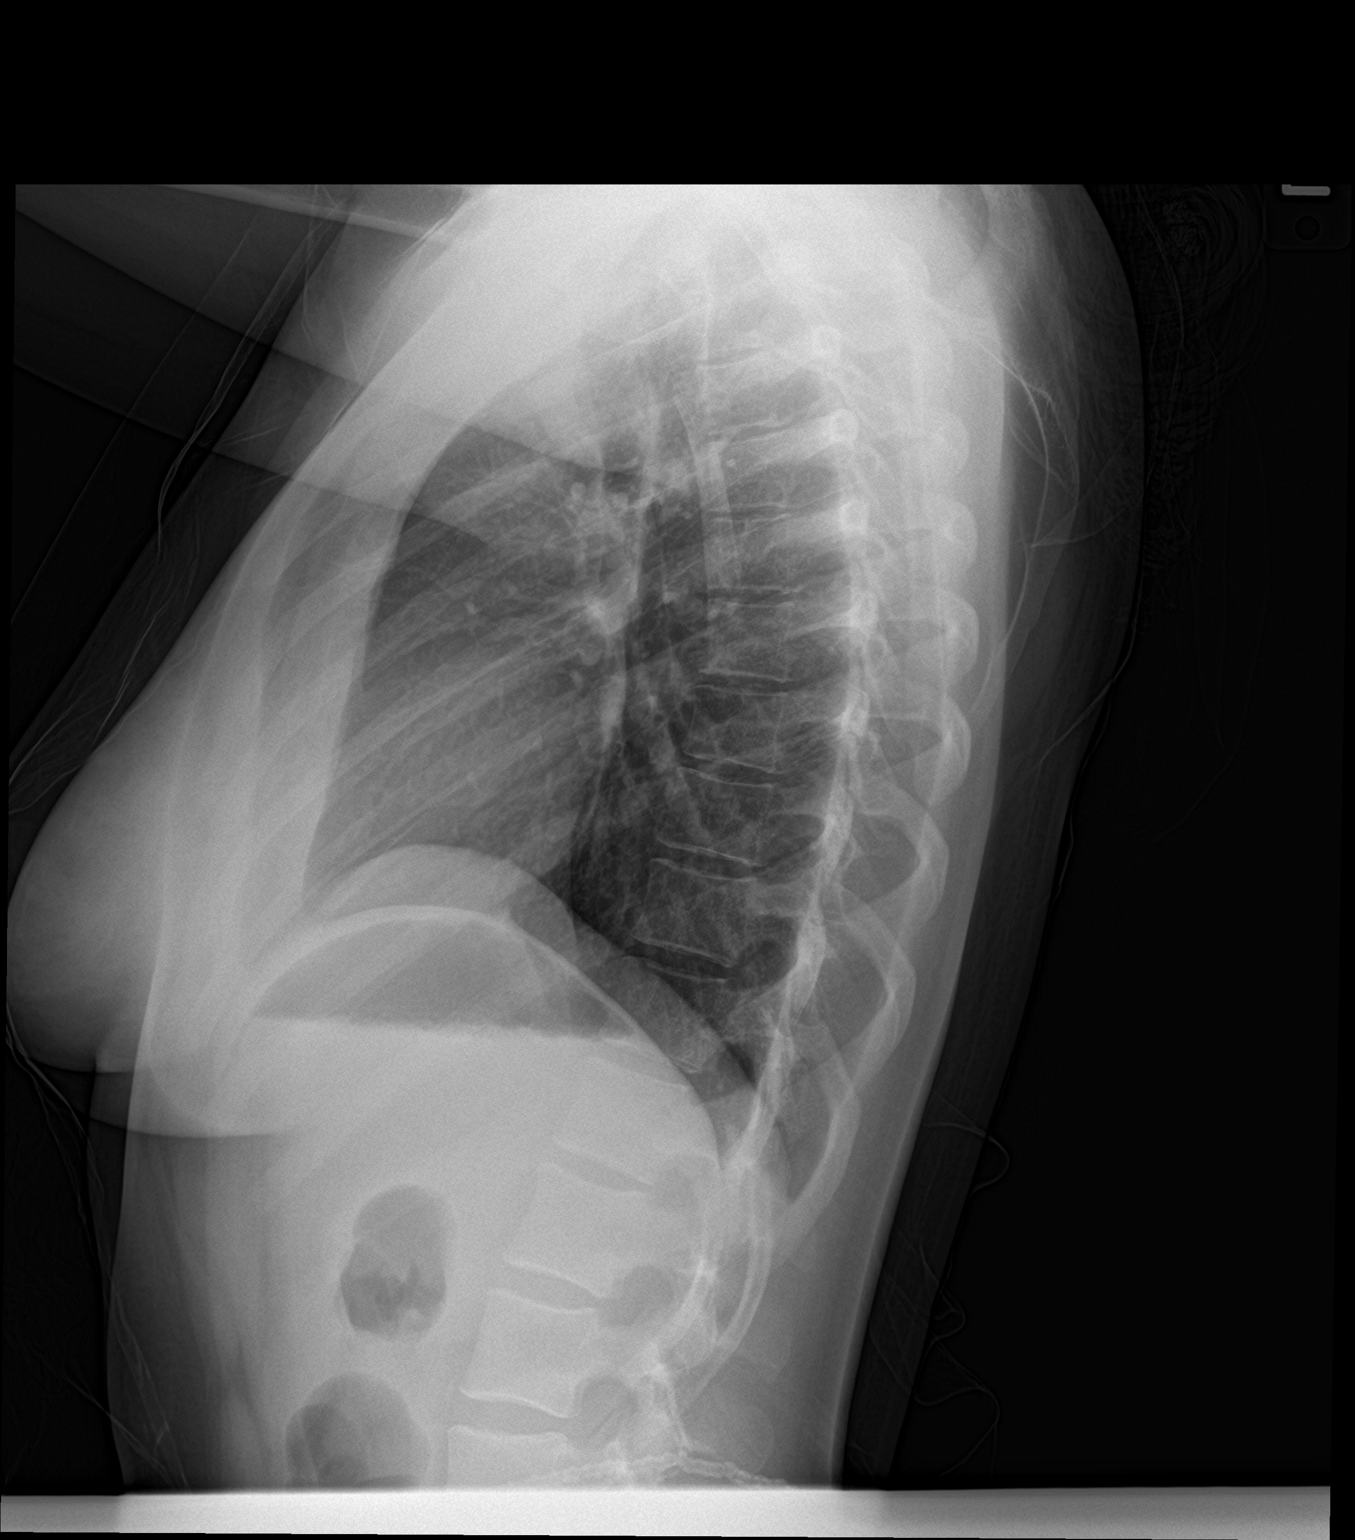

[2 of 2 positions shown; findings below may reference images not displayed]

FINDINGS: The heart size and mediastinal contours are within normal limits.
Both lungs are clear. The visualized skeletal structures are
unremarkable.
IMPRESSION: No active cardiopulmonary disease.

## 2023-04-26 ENCOUNTER — Ambulatory Visit: Payer: BC Managed Care – PPO | Admitting: Obstetrics and Gynecology
